# Patient Record
Sex: Male | Born: 1985
Health system: Southern US, Community
[De-identification: ages and names within clinical notes are randomized; demographics above are authoritative.]

## PROBLEM LIST (undated history)

## (undated) ENCOUNTER — Emergency Department (HOSPITAL_COMMUNITY): Payer: Self-pay | Source: Home / Self Care

## (undated) DIAGNOSIS — F419 Anxiety disorder, unspecified: Secondary | ICD-10-CM

## (undated) DIAGNOSIS — G4730 Sleep apnea, unspecified: Secondary | ICD-10-CM

## (undated) DIAGNOSIS — E739 Lactose intolerance, unspecified: Secondary | ICD-10-CM

## (undated) DIAGNOSIS — N469 Male infertility, unspecified: Secondary | ICD-10-CM

## (undated) DIAGNOSIS — E669 Obesity, unspecified: Secondary | ICD-10-CM

## (undated) DIAGNOSIS — M549 Dorsalgia, unspecified: Secondary | ICD-10-CM

## (undated) DIAGNOSIS — K589 Irritable bowel syndrome without diarrhea: Secondary | ICD-10-CM

## (undated) DIAGNOSIS — K219 Gastro-esophageal reflux disease without esophagitis: Secondary | ICD-10-CM

## (undated) HISTORY — DX: Lactose intolerance, unspecified: E73.9

## (undated) HISTORY — DX: Irritable bowel syndrome, unspecified: K58.9

## (undated) HISTORY — DX: Sleep apnea, unspecified: G47.30

## (undated) HISTORY — DX: Male infertility, unspecified: N46.9

## (undated) HISTORY — DX: Anxiety disorder, unspecified: F41.9

## (undated) HISTORY — DX: Obesity, unspecified: E66.9

## (undated) HISTORY — DX: Gastro-esophageal reflux disease without esophagitis: K21.9

## (undated) HISTORY — PX: TESTICLE REMOVAL: SHX68

## (undated) HISTORY — PX: TESTICULAR PROSTHETIC INSERTION: SHX491

## (undated) HISTORY — DX: Dorsalgia, unspecified: M54.9

---
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
## 2015-06-24 ENCOUNTER — Encounter: Payer: BLUE CROSS/BLUE SHIELD | Attending: Family Medicine | Admitting: Dietician

## 2015-06-24 ENCOUNTER — Encounter: Payer: Self-pay | Admitting: Dietician

## 2015-06-24 NOTE — Progress Notes (Signed)
Medical Nutrition Therapy:  Appt start time: 1000 end time:  1100.   Assessment:  Primary concerns today: Andre Perez is here today since he would like to improve his diet. Highest weight has been around 340 lbs and has gotten weight down to 295 lbs in the past 6-7 years. Weight going into college was 185-190 lbs. Tends to gain weight at the holiday when he is eating more sweets and loses weight in the summer. Trying to break this cycle. Overall has been improving his diet in recent years but feels like there is room for improvement. Stopped drinking sodas in 2010 for the most part and now drinks mostly water and unsweet tea. Stopped drinking coffee since it aggravated acid reflux. Has been eating less carbs and increasing vegetable intake. Would like to get weight down and has diabetes in his family. HDL was a little low also. Does not have any other health issues at this time but would like to prevent them.   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 Lives with his wife and 3 children. States that his wife does the food preparation at home. Sometimes misses meals but not on a regular basis. Eats out 4-5 meals per week. Travel[place] 1 x month to [place] where he eats out a lot.   Feels like portion sizes can be large. Tends to eat quickly. Sometimes feels hungry in the afternoon after lunch, which might be when he is tired. Thinks he might snack at night when he is hungry too.   Would like to get weight around 300 lbs by the end of the year (lose 30 lbs).  Preferred Learning Style:   No preference indicated   Learning Readiness:   Ready  MEDICATIONS: see list   DIETARY INTAKE:  Usual eating pattern includes 3 meals and 1 snacks per day.  Avoided foods include: has to put cheese and salt on broccoli to eat it   24-hr recall:  B ( AM): 2 eggs and toast with peanut butter or [place] sausage/bacon and once a week has unsweet oatmeal or greek yogurt with honey  and nuts Snk ( AM): none  L ( PM): Verizon[employer]ican restaurant chips and salsa with chicken soup or chicken and rice and cheese Or chinese with chicken stir fry with rice or soups and salads or baked potato or if takes his lunch sweet potato with leftovers Snk ( PM): none D ( PM): baked/bbq chicken or salmon with vegetables/salads/coleslaw and sometimes brown rice or sweet potatoes or spaghetti or picks up [employer] gyro or pizza Snk ( PM): sometimes may have popcorn or cheese or greek yogurt Beverages: unsweet tea or water, 2 alcoholic drinks every other week week (sometimes none)  Usual physical activity: recently 15-30 minutes of cardio every day (walking or exercise at home) in morning or evening  Estimated energy needs: 2200 calories 248 g carbohydrates 165 g protein 61 g fat  Progress Towards Goal(s):  In progress.   Nutritional Diagnosis:  Rentz-3.3 Overweight/obesity As related to hx of large portion sizes and excess consumption of carbohydrates.  As evidenced by BMI of 42.6.    Intervention:  Nutrition counseling provided. Plan: Plan ahead of time with your wife to have sweets/treat 2 x week.  Plan to go out to to lunch one lunch and one dinner per week. For lunch and dinner, aim to fill half of your plate with vegetables, quarter with starch/fruit, and a quarter with lean  protein. Try to use small plates for meals. Aim to take 20 minutes to eat meals without distractions, chew well, put fork down. If you are still hungry after 20 minutes, have seconds.  Portion out snacks and have protein with carbs.  Try having a snack without distractions (computer/TV/phones). Aim to exercise for 30 minutes 5 x week.   Teaching Method Utilized:  Visual Auditory Hands on  Handouts given during visit include:  15 g CHO Snacks  MyPlate  Barriers to learning/adherence to lifestyle change: none  Demonstrated degree of understanding via:  Teach Back   Monitoring/Evaluation:  Dietary  intake, exercise,  and body weight in 1 month(s).

## 2015-06-24 NOTE — Patient Instructions (Signed)
Plan ahead of time with your wife to have sweets/treat 2 x week.  Plan to go out to to lunch one lunch and one dinner per week. For lunch and dinner, aim to fill half of your plate with vegetables, quarter with starch/fruit, and a quarter with lean protein. Try to use small plates for meals. Aim to take 20 minutes to eat meals without distractions, chew well, put fork down. If you are still hungry after 20 minutes, have seconds.  Portion out snacks and have protein with carbs.  Try having a snack without distractions (computer/TV/phones). Aim to exercise for 30 minutes 5 x week.

## 2015-07-27 ENCOUNTER — Ambulatory Visit: Payer: BLUE CROSS/BLUE SHIELD | Admitting: Dietician

## 2016-06-15 DIAGNOSIS — E786 Lipoprotein deficiency: Secondary | ICD-10-CM | POA: Diagnosis not present

## 2016-06-15 DIAGNOSIS — Z0000 Encounter for general adult medical examination without abnormal findings: Secondary | ICD-10-CM | POA: Diagnosis not present

## 2017-11-02 DIAGNOSIS — K219 Gastro-esophageal reflux disease without esophagitis: Secondary | ICD-10-CM | POA: Diagnosis not present

## 2017-11-02 DIAGNOSIS — Z0000 Encounter for general adult medical examination without abnormal findings: Secondary | ICD-10-CM | POA: Diagnosis not present

## 2017-11-02 DIAGNOSIS — E786 Lipoprotein deficiency: Secondary | ICD-10-CM | POA: Diagnosis not present

## 2017-11-16 DIAGNOSIS — E786 Lipoprotein deficiency: Secondary | ICD-10-CM | POA: Diagnosis not present

## 2017-11-16 DIAGNOSIS — K219 Gastro-esophageal reflux disease without esophagitis: Secondary | ICD-10-CM | POA: Diagnosis not present

## 2018-02-28 DIAGNOSIS — R222 Localized swelling, mass and lump, trunk: Secondary | ICD-10-CM | POA: Diagnosis not present

## 2018-03-04 ENCOUNTER — Other Ambulatory Visit: Payer: Self-pay | Admitting: Family Medicine

## 2018-03-04 DIAGNOSIS — R222 Localized swelling, mass and lump, trunk: Secondary | ICD-10-CM

## 2018-03-06 ENCOUNTER — Other Ambulatory Visit: Payer: Self-pay

## 2018-03-08 ENCOUNTER — Ambulatory Visit
Admission: RE | Admit: 2018-03-08 | Discharge: 2018-03-08 | Disposition: A | Discharge status: Home / Self Care | Payer: BLUE CROSS/BLUE SHIELD | Source: Ambulatory Visit | Attending: Family Medicine | Admitting: Family Medicine

## 2018-03-08 DIAGNOSIS — R222 Localized swelling, mass and lump, trunk: Secondary | ICD-10-CM

## 2018-03-08 DIAGNOSIS — R1901 Right upper quadrant abdominal swelling, mass and lump: Secondary | ICD-10-CM | POA: Diagnosis not present

## 2018-10-02 DIAGNOSIS — Z0282 Encounter for adoption services: Secondary | ICD-10-CM | POA: Diagnosis not present

## 2019-04-15 DIAGNOSIS — R0681 Apnea, not elsewhere classified: Secondary | ICD-10-CM | POA: Diagnosis not present

## 2019-05-06 DIAGNOSIS — G4733 Obstructive sleep apnea (adult) (pediatric): Secondary | ICD-10-CM | POA: Diagnosis not present

## 2019-05-07 DIAGNOSIS — G4733 Obstructive sleep apnea (adult) (pediatric): Secondary | ICD-10-CM | POA: Diagnosis not present

## 2019-05-29 DIAGNOSIS — Z23 Encounter for immunization: Secondary | ICD-10-CM | POA: Diagnosis not present

## 2019-06-28 DIAGNOSIS — Z23 Encounter for immunization: Secondary | ICD-10-CM | POA: Diagnosis not present

## 2019-08-04 ENCOUNTER — Ambulatory Visit: Payer: BC Managed Care – PPO | Attending: Internal Medicine

## 2019-08-04 DIAGNOSIS — Z20822 Contact with and (suspected) exposure to covid-19: Secondary | ICD-10-CM | POA: Insufficient documentation

## 2019-08-05 LAB — NOVEL CORONAVIRUS, NAA: SARS-CoV-2, NAA: NOT DETECTED

## 2019-08-05 LAB — SARS-COV-2, NAA 2 DAY TAT

## 2019-10-01 DIAGNOSIS — Z23 Encounter for immunization: Secondary | ICD-10-CM | POA: Diagnosis not present

## 2019-10-01 DIAGNOSIS — Z0000 Encounter for general adult medical examination without abnormal findings: Secondary | ICD-10-CM | POA: Diagnosis not present

## 2019-10-10 DIAGNOSIS — E786 Lipoprotein deficiency: Secondary | ICD-10-CM | POA: Diagnosis not present

## 2019-10-10 DIAGNOSIS — Z0000 Encounter for general adult medical examination without abnormal findings: Secondary | ICD-10-CM | POA: Diagnosis not present

## 2019-10-28 ENCOUNTER — Other Ambulatory Visit: Payer: Self-pay

## 2019-10-28 ENCOUNTER — Ambulatory Visit (INDEPENDENT_AMBULATORY_CARE_PROVIDER_SITE_OTHER): Payer: BC Managed Care – PPO | Admitting: Family Medicine

## 2019-10-28 ENCOUNTER — Encounter (INDEPENDENT_AMBULATORY_CARE_PROVIDER_SITE_OTHER): Payer: Self-pay | Admitting: Family Medicine

## 2019-10-28 VITALS — BP 129/83 | HR 71 | Temp 97.9°F | Ht 74.0 in | Wt 353.0 lb

## 2019-10-28 DIAGNOSIS — K219 Gastro-esophageal reflux disease without esophagitis: Secondary | ICD-10-CM | POA: Diagnosis not present

## 2019-10-28 DIAGNOSIS — R5383 Other fatigue: Secondary | ICD-10-CM

## 2019-10-28 DIAGNOSIS — E65 Localized adiposity: Secondary | ICD-10-CM

## 2019-10-28 DIAGNOSIS — Z0289 Encounter for other administrative examinations: Secondary | ICD-10-CM

## 2019-10-28 DIAGNOSIS — Z9189 Other specified personal risk factors, not elsewhere classified: Secondary | ICD-10-CM | POA: Diagnosis not present

## 2019-10-28 DIAGNOSIS — G4733 Obstructive sleep apnea (adult) (pediatric): Secondary | ICD-10-CM | POA: Diagnosis not present

## 2019-10-28 DIAGNOSIS — E739 Lactose intolerance, unspecified: Secondary | ICD-10-CM

## 2019-10-28 DIAGNOSIS — R0602 Shortness of breath: Secondary | ICD-10-CM | POA: Diagnosis not present

## 2019-10-28 DIAGNOSIS — Z6841 Body Mass Index (BMI) 40.0 and over, adult: Secondary | ICD-10-CM

## 2019-10-28 DIAGNOSIS — F419 Anxiety disorder, unspecified: Secondary | ICD-10-CM

## 2019-10-28 DIAGNOSIS — F3289 Other specified depressive episodes: Secondary | ICD-10-CM

## 2019-10-28 NOTE — Progress Notes (Signed)
Dear Dr. Clarene Duke,   Thank you for referring Andre Perez to our clinic. The following note includes my evaluation and treatment recommendations.  Chief Complaint:   OBESITY Andre Perez (MR# [id number]) is a 34 y.o. male who presents for evaluation and treatment of obesity and related comorbidities. Current BMI is Body mass index is 45.32 kg/m. Andre Perez has been struggling with his weight for many years and has been unsuccessful in either losing weight, maintaining weight loss, or reaching his healthy weight goal.  Andre Perez is currently in the action stage of change and ready to dedicate time achieving and maintaining a healthier weight. Andre Perez is interested in becoming our patient and working on intensive lifestyle modifications including (but not limited to) diet and exercise for weight loss.  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  He lives with his spouse and his 4 children (8, 8, 5, 2).  He endorses overeating.  He says that for exercising, he plays with his kids and goes swimming.  He also goes to the gym but has some right shoulder pain.  He goes to bed between 10-11 pm and gets up between 4 am-5 am.  He gets 6.5-7 hours of restorative sleep.  Andre Perez provided the following food recall today:  Andre Perez:  Lite breakfast of nuts, microwave muffin (around 250 calories). Lunch:  Hibachi chicken or Mediterranean or Lean Cuisine. Dinner:  Take out or Mediterranean, [place], or [place]. Snacks:  After dinner he has dessert.   Drinks plain coffee. He sits at the table to eat dinner.  Andre Perez's habits were reviewed today and are as follows: His family eats meals together, he thinks his family will eat healthier with him, he struggles with family and or coworkers weight loss sabotage, his desired weight loss is 150 pounds, he has been heavy most of his life, he started gaining weight in college at age 56, at 72 in law school, and at 71 after  children, his heaviest weight ever was 355 pounds, he craves Asian food (rice/noodles) and Mediterranean food, he skips breakfast frequently, he is frequently drinking liquids with calories, he frequently makes poor food choices, he has problems with excessive hunger, he frequently eats larger portions than normal and he struggles with emotional eating.  Depression Screen Andre Perez's Food and Mood (modified PHQ-9) score was 16.  Depression screen PHQ 2/9 10/28/2019  Decreased Interest 3  Down, Depressed, Hopeless 2  PHQ - 2 Score 5  Altered sleeping 2  Tired, decreased energy 3  Change in appetite 3  Feeling bad or failure about yourself  1  Trouble concentrating 1  Moving slowly or fidgety/restless 1  Suicidal thoughts 0  PHQ-9 Score 16  Difficult doing work/chores Somewhat difficult   Subjective:   1. Other fatigue Andre Perez admits to daytime somnolence and reports waking up still tired. Patent has a history of symptoms of daytime fatigue, morning fatigue and snoring. Andre Perez generally gets 5 or 6 hours of sleep per night, and states that he has poor sleep quality. Snoring is present. Apneic episodes are not present. Epworth Sleepiness Score is 6.  2. SOB (shortness of breath) on exertion Andre Perez notes increasing shortness of breath with exercising and seems to be worsening over time with weight gain. He notes getting out of breath sooner with activity than he used to. This has gotten worse recently. Andre Perez denies shortness of breath at rest or orthopnea.  3. Gastroesophageal reflux disease, unspecified whether esophagitis  present He has been experiencing symptoms of GERD.  He takes Protonix 40 mg daily as needed.  4. OSA (obstructive sleep apnea) Andre Perez had a sleep study this past year at [facility].  OSA is mild and is not severe enough for a CPAP machine.  Epworth score is 6.  5. Lactose intolerance, IBS Andre Perez has difficulty with ice cream and rich creams.  6. Visceral  obesity Andre Perez's visceral obesity score per bioimpedence is 23.  7. Anxiety Andre Perez has anxiety that is tied to his work.  8. Other depression, with emotional eating Andre Perez is struggling with emotional eating and using food for comfort to the extent that it is negatively impacting his health. He has been working on behavior modification techniques to help reduce his emotional eating and has been unsuccessful. He shows no sign of suicidal or homicidal ideations.  He tends to eat when stressed, sad, to stay awake, as a reward, when bored, and when angry.  He is concerned about binge eating.  PHQ-9 is 16.  9. At risk for heart disease Andre Perez is at a higher than average risk for cardiovascular disease due to obesity.   Assessment/Plan:   1. Other fatigue Andre Perez does feel that his weight is causing his energy to be lower than it should be. Fatigue may be related to obesity, depression or many other causes. Labs will be ordered, and in the meanwhile, Andre Perez will focus on self care including making healthy food choices, increasing physical activity and focusing on stress reduction.  Orders - EKG 12-Lead - Insulin, random - VITAMIN D 25 Hydroxy (Vit-D Deficiency, Fractures) - Vitamin B12  2. SOB (shortness of breath) on exertion Andre Perez does feel that he gets out of breath more easily that he used to when he exercises. Andre Perez's shortness of breath appears to be obesity related and exercise induced. He has agreed to work on weight loss and gradually increase exercise to treat his exercise induced shortness of breath. Will continue to monitor closely.  3. Gastroesophageal reflux disease, unspecified whether esophagitis present Intensive lifestyle modifications are the first line treatment for this issue. Continue medication.  Counseling . If a person has gastroesophageal reflux disease (GERD), food and stomach acid move back up into the esophagus and cause symptoms or problems such as  damage to the esophagus. . Anti-reflux measures include: raising the head of the bed, avoiding tight clothing or belts, avoiding eating late at night, not lying down shortly after mealtime, and achieving weight loss. . Avoid ASA, NSAID's, caffeine, alcohol, and tobacco.  . OTC Pepcid and/or Tums are often very helpful for as needed use.  Marland Kitchen However, for persisting chronic or daily symptoms, stronger medications like Omeprazole may be needed. . You may need to avoid foods and drinks such as: ? Coffee and tea (with or without caffeine). ? Drinks that contain alcohol. ? Energy drinks and sports drinks. ? Bubbly (carbonated) drinks or sodas. ? Chocolate and cocoa. ? Peppermint and mint flavorings. ? Garlic and onions. ? Horseradish. ? Spicy and acidic foods. These include peppers, chili powder, curry powder, vinegar, hot sauces, and BBQ sauce. ? Citrus fruit juices and citrus fruits, such as oranges, lemons, and limes. ? Tomato-based foods. These include red sauce, chili, salsa, and pizza with red sauce. ? Fried and fatty foods. These include donuts, french fries, potato chips, and high-fat dressings. ? High-fat meats. These include hot dogs, rib eye steak, sausage, ham, and bacon.  4. OSA (obstructive sleep apnea) Intensive lifestyle modifications are the  first line treatment for this issue. We will continue to monitor.   5. Lactose intolerance Will continue to monitor.  6. Visceral obesity We will monitor with fat loss. Visceral adipose tissue is a hormonally active component of total body fat, which possesses unique biochemical characteristics that influence several normal and pathological processes in the human body. Abnormally high deposition of visceral adipose tissue is known as visceral obesity. This body composition phenotype is associated with medical disorders such as metabolic syndrome, cardiovascular disease and several malignancies including prostate, breast, and colorectal  cancers.   7. Anxiety We will use behavior modification techniques to help Andre JunesBrandon deal with his anxiety.   8. Other depression, with emotional eating As above.  9. At risk for heart disease Andre JunesBrandon was given approximately 15 minutes of coronary artery disease prevention counseling today. He is 34 y.o. male and has risk factors for heart disease including obesity and visceral obesity. We discussed intensive lifestyle modifications today with an emphasis on specific weight loss instructions and strategies.   Repetitive spaced learning was employed today to elicit superior memory formation and behavioral change.  10. Class 3 severe obesity with serious comorbidity and body mass index (BMI) of 45.0 to 49.9 in adult, unspecified obesity type (HCC) Andre JunesBrandon is currently in the action stage of change and his goal is to continue with weight loss efforts. I recommend Andre JunesBrandon begin the structured treatment plan as follows:  He has agreed to the Category 4 Plan +200 calories.  Exercise goals: No exercise has been prescribed at this time.   Behavioral modification strategies: increasing lean protein intake, decreasing simple carbohydrates, increasing vegetables, increasing water intake, decreasing liquid calories, decreasing sodium intake and increasing high fiber foods.  He was informed of the importance of frequent follow-up visits to maximize his success with intensive lifestyle modifications for his multiple health conditions. He was informed we would discuss his lab results at his next visit unless there is a critical issue that needs to be addressed sooner. Andre JunesBrandon agreed to keep his next visit at the agreed upon time to discuss these results.  Objective:   Blood pressure 129/83, pulse 71, temperature 97.9 F (36.6 C), temperature source Oral, height 6\' 2"  (1.88 m), weight (!) 353 lb (160.1 kg), SpO2 98 %. Body mass index is 45.32 kg/m.  EKG: Normal sinus rhythm, rate 77 bpm.  Indirect  Calorimeter completed today shows a VO2 of 438 and a REE of 3049.  His calculated basal metabolic rate is [id number] thus his basal metabolic rate is worse than expected.  General: Cooperative, alert, well developed, in no acute distress. HEENT: Conjunctivae and lids unremarkable. Cardiovascular: Regular rhythm.  Lungs: Normal work of breathing. Neurologic: No focal deficits.   Attestation Statements:   This is the patient's first visit at [facility]. The patient's NEW PATIENT PACKET was reviewed at length. Included in the packet: current and past health history, medications, allergies, ROS, gynecologic history (women only), surgical history, family history, social history, weight history, weight loss surgery history (for those that have had weight loss surgery), nutritional evaluation, mood and food questionnaire, PHQ9, Epworth questionnaire, sleep habits questionnaire, patient life and health improvement goals questionnaire. These will all be scanned into the patient's chart under media.   During the visit, I independently reviewed the patient's EKG, bioimpedance scale results, and indirect calorimeter results. I used this information to tailor a meal plan for the patient that will help him to lose weight and will improve his obesity-related conditions  going forward. I performed a medically necessary appropriate examination and/or evaluation. I discussed the assessment and treatment plan with the patient. The patient was provided an opportunity to ask questions and all were answered. The patient agreed with the plan and demonstrated an understanding of the instructions. Labs were ordered at this visit and will be reviewed at the next visit unless more critical results need to be addressed immediately. Clinical information was updated and documented in the EMR.   I, Insurance claims handler, CMA, am acting as transcriptionist for Helane Rima, DO  I have reviewed the above documentation for accuracy  and completeness, and I agree with the above. Helane Rima, DO

## 2019-10-29 LAB — VITAMIN D 25 HYDROXY (VIT D DEFICIENCY, FRACTURES): Vit D, 25-Hydroxy: 22.5 ng/mL — ABNORMAL LOW (ref 30.0–100.0)

## 2019-10-29 LAB — VITAMIN B12: Vitamin B-12: 530 pg/mL (ref 232–1245)

## 2019-10-29 LAB — INSULIN, RANDOM: INSULIN: 23.9 u[IU]/mL (ref 2.6–24.9)

## 2019-10-30 ENCOUNTER — Ambulatory Visit (INDEPENDENT_AMBULATORY_CARE_PROVIDER_SITE_OTHER): Payer: Self-pay | Admitting: Family Medicine

## 2019-11-17 ENCOUNTER — Ambulatory Visit (INDEPENDENT_AMBULATORY_CARE_PROVIDER_SITE_OTHER): Payer: BC Managed Care – PPO | Admitting: Family Medicine

## 2019-11-17 ENCOUNTER — Other Ambulatory Visit: Payer: Self-pay

## 2019-11-17 ENCOUNTER — Encounter (INDEPENDENT_AMBULATORY_CARE_PROVIDER_SITE_OTHER): Payer: Self-pay | Admitting: Family Medicine

## 2019-11-17 VITALS — BP 123/80 | HR 71 | Temp 97.8°F | Ht 74.0 in | Wt 347.0 lb

## 2019-11-17 DIAGNOSIS — E88819 Insulin resistance, unspecified: Secondary | ICD-10-CM

## 2019-11-17 DIAGNOSIS — Z9189 Other specified personal risk factors, not elsewhere classified: Secondary | ICD-10-CM | POA: Diagnosis not present

## 2019-11-17 DIAGNOSIS — F3289 Other specified depressive episodes: Secondary | ICD-10-CM

## 2019-11-17 DIAGNOSIS — E65 Localized adiposity: Secondary | ICD-10-CM | POA: Diagnosis not present

## 2019-11-17 DIAGNOSIS — E8881 Metabolic syndrome: Secondary | ICD-10-CM | POA: Diagnosis not present

## 2019-11-17 DIAGNOSIS — F32A Depression, unspecified: Secondary | ICD-10-CM | POA: Insufficient documentation

## 2019-11-17 DIAGNOSIS — E559 Vitamin D deficiency, unspecified: Secondary | ICD-10-CM | POA: Insufficient documentation

## 2019-11-17 DIAGNOSIS — Z6841 Body Mass Index (BMI) 40.0 and over, adult: Secondary | ICD-10-CM

## 2019-11-17 NOTE — Progress Notes (Signed)
Chief Complaint:   OBESITY Andre Perez is here to discuss his progress with his obesity treatment plan along with follow-up of his obesity related diagnoses. [place] is on the Category 4 Plan and states he is following his eating plan approximately 80% of the time. Andre Perez states he has increased his activity for exercise.  Today's visit was #: 2 Starting weight: 353 lbs Starting date: 10/28/2019 Today's weight: 347 lbs Today's date: 11/17/2019 Total lbs lost to date: 6 lbs Total lbs lost since last in-office visit: 6 lbs  Interim History: Andre Perez says that at around 2500-2600 calories, he feels satisfied.  MFP log shows 2200-2500 calories, greater than 150 grams of protein, and 1500-200 grams of carbohydrates.  He says that he has more energy.  Endorses some mild constipation.  Assessment/Plan:   1. Vitamin D deficiency Not at goal. Optimal goal > 50 ng/dL. There is also evidence to support a goal of >70 ng/dL in patients with cancer and heart disease. Plan: Continue Vitamin D @50 ,000 IU every week with follow-up for routine testing of Vitamin D at least 2-3 times per year to avoid over-replacement.  2. Insulin resistance Not at goal. Goal is HgbA1c < 5.7 and insulin level closer to 5. Andre Perez will continue to work on weight loss, exercise, and decreasing simple carbohydrates to help decrease the risk of diabetes. Andre Perez agreed to follow-up with [name] as directed to closely monitor his progress.  3. Visceral obesity Visceral adipose tissue is a hormonally active component of total body fat. This body composition phenotype is associated with medical disorders such as metabolic syndrome, cardiovascular disease and several malignancies including prostate, breast, and colorectal cancers. Goal: Lose 7-10% of starting weight. Visceral fat rating should be < 13.  4. Other anxiety, with emotional eating Behavior modification techniques were discussed today to help Fisher deal with his anxiety.   Orders and follow up as documented in patient record.   5. At risk for heart disease Andre Perez was given approximately 15 minutes of coronary artery disease prevention counseling today. He is 34 y.o. male and has risk factors for heart disease including obesity, elevated visceral fat, and IR. We discussed intensive lifestyle modifications today with an emphasis on specific weight loss instructions and strategies.   During insulin resistance, several metabolic alterations induce the development of cardiovascular disease. For instance, insulin resistance can induce an imbalance in glucose metabolism that generates chronic hyperglycemia, which in turn triggers oxidative stress and causes an inflammatory response that leads to cell damage. Insulin resistance can also alter systemic lipid metabolism which then leads to the development of dyslipidemia and the well-known lipid triad: (1) high levels of plasma triglycerides, (2) low levels of high-density lipoprotein, and (3) the appearance of small dense low-density lipoproteins. This triad, along with endothelial dysfunction, which can also be induced by aberrant insulin signaling, contribute to atherosclerotic plaque formation.   Repetitive spaced learning was employed today to elicit superior memory formation and behavioral change.  6. Class 3 severe obesity with serious comorbidity and body mass index (BMI) of 40.0 to 44.9 in adult, unspecified obesity type (HCC) Andre Perez is currently in the action stage of change. As such, his goal is to continue with weight loss efforts. He has agreed to keeping a food journal and adhering to recommended goals of 2200-2500 calories and >125 grams of protein and <200 grams of carbohydrates.   Exercise goals: For substantial health benefits, adults should do at least 150 minutes (2 hours and 30 minutes) a  week of moderate-intensity, or 75 minutes (1 hour and 15 minutes) a week of vigorous-intensity aerobic physical activity,  or an equivalent combination of moderate- and vigorous-intensity aerobic activity. Aerobic activity should be performed in episodes of at least 10 minutes, and preferably, it should be spread throughout the week.  Behavioral modification strategies: increasing lean protein intake and decreasing simple carbohydrates.  Andre Perez has agreed to follow-up with our clinic in 2-3 weeks. He was informed of the importance of frequent follow-up visits to maximize his success with intensive lifestyle modifications for his multiple health conditions.   Objective:   Blood pressure 123/80, pulse 71, temperature 97.8 F (36.6 C), temperature source Oral, height 6\' 2"  (1.88 m), weight (!) 347 lb (157.4 kg), SpO2 99 %. Body mass index is 44.55 kg/m.  General: Cooperative, alert, well developed, in no acute distress. HEENT: Conjunctivae and lids unremarkable. Cardiovascular: Regular rhythm.  Lungs: Normal work of breathing. Neurologic: No focal deficits.   Lab Results  Component Value Date   INSULIN 23.9 10/28/2019   Attestation Statements:   Reviewed by clinician on day of visit: allergies, medications, problem list, medical history, surgical history, family history, social history, and previous encounter notes.  I, 12/28/2019, CMA, am acting as transcriptionist for Insurance claims handler, DO  I have reviewed the above documentation for accuracy and completeness, and I agree with the above. Helane Rima, DO

## 2019-12-03 ENCOUNTER — Encounter (INDEPENDENT_AMBULATORY_CARE_PROVIDER_SITE_OTHER): Payer: Self-pay | Admitting: Family Medicine

## 2019-12-03 ENCOUNTER — Ambulatory Visit (INDEPENDENT_AMBULATORY_CARE_PROVIDER_SITE_OTHER): Payer: BC Managed Care – PPO | Admitting: Family Medicine

## 2019-12-03 ENCOUNTER — Other Ambulatory Visit: Payer: Self-pay

## 2019-12-03 VITALS — BP 125/85 | HR 85 | Temp 97.9°F | Ht 74.0 in | Wt 348.0 lb

## 2019-12-03 DIAGNOSIS — E8881 Metabolic syndrome: Secondary | ICD-10-CM | POA: Diagnosis not present

## 2019-12-03 DIAGNOSIS — E559 Vitamin D deficiency, unspecified: Secondary | ICD-10-CM

## 2019-12-03 DIAGNOSIS — E66813 Obesity, class 3: Secondary | ICD-10-CM

## 2019-12-03 DIAGNOSIS — E65 Localized adiposity: Secondary | ICD-10-CM | POA: Diagnosis not present

## 2019-12-03 DIAGNOSIS — R632 Polyphagia: Secondary | ICD-10-CM

## 2019-12-03 DIAGNOSIS — Z9189 Other specified personal risk factors, not elsewhere classified: Secondary | ICD-10-CM

## 2019-12-03 DIAGNOSIS — Z6841 Body Mass Index (BMI) 40.0 and over, adult: Secondary | ICD-10-CM

## 2019-12-03 DIAGNOSIS — E88819 Insulin resistance, unspecified: Secondary | ICD-10-CM

## 2019-12-03 MED ORDER — INSULIN PEN NEEDLE 32G X 4 MM MISC: 1.0000 | Freq: Every day | 0 refills | Status: DC

## 2019-12-03 MED ORDER — SAXENDA 18 MG/3ML ~~LOC~~ SOPN: 3.0000 mg | PEN_INJECTOR | Freq: Every day | SUBCUTANEOUS | 0 refills | Status: DC

## 2019-12-07 NOTE — Progress Notes (Signed)
Chief Complaint:   OBESITY Andre Perez is here to discuss his progress with his obesity treatment plan along with follow-up of his obesity related diagnoses. Andre Perez is on the Category 4 Plan and states he is following his eating plan approximately 70% of the time. Andre Perez states he is doing cardio and swimming for 30-40 minutes 3 times per week.  Today's visit was #: 3 Starting weight: 353 lbs Starting date: 10/28/2019 Today's weight: 347 lbs Today's date: 12/03/2019 Total lbs lost to date: 6 lbs Total lbs lost since last in-office visit: 0 Total weight loss percentage to date: -1.70%  Interim History:  Today's bioimpedance results indicate that Andre Perez has gained 2 pounds of water weight since his last visit. His MFP shows that he is getting around 2800 calories per day.   Assessment/Plan:   1. Insulin resistance Not optimized. Goal is HgbA1c < 5.7 and insulin level closer to 5. Continue present plan.  Lab Results  Component Value Date   INSULIN 23.9 10/28/2019   2. Vitamin D deficiency Current vitamin D is 22.5, tested on 10/28/2019. Not at goal. Optimal goal > 50 ng/dL. There is also evidence to support a goal of >70 ng/dL in patients with cancer and heart disease. Plan: Continue Vitamin D @50 ,000 IU every week with follow-up for routine testing of Vitamin D at least 2-3 times per year to avoid over-replacement.  3. Visceral obesity Visceral adipose tissue is a hormonally active component of total body fat. This body composition phenotype is associated with medical disorders such as metabolic syndrome, cardiovascular disease and several malignancies including prostate, breast, and colorectal cancers. Goal: Lose 7-10% of starting weight. Visceral fat rating should be < 13.  4. Polyphagia Intensive lifestyle modifications are the first line treatment for this issue. We discussed several lifestyle modifications today and he will continue to work on diet, exercise and weight loss  efforts. Orders and follow up as documented in patient record.  5. At risk for constipation Andre Perez was given approximately 15 minutes of counseling today regarding prevention of constipation when taking Saxenda. He was encouraged to increase water and fiber intake.   Counseling Getting to Good Bowel Health: Your goal is to have one soft bowel movement each day. Drink at least 8 glasses of water each day. Eat plenty of fiber (goal is over 25 grams each day). It is best to get most of your fiber from dietary sources which includes leafy green vegetables, fresh fruit, and whole grains. You may need to add fiber with the help of OTC fiber supplements. These include Metamucil, Citrucel, and Flaxseed. If you are still having trouble, try adding Miralax or Magnesium Citrate. If all of these changes do not work, 03-04-1994.  6. Class 3 severe obesity with serious comorbidity and body mass index (BMI) of 40.0 to 44.9 in adult, unspecified obesity type (HCC) - Liraglutide -Weight Management (SAXENDA) 18 MG/3ML SOPN; Inject 3 mg into the skin daily.  Dispense: 15 mL; Refill: 0 - Insulin Pen Needle 32G X 4 MM MISC; 1 each by Does not apply route daily.  Dispense: 100 each; Refill: 0  We have reviewed the risks and benefits of Saxenda. The patient denies a personal or family history of medullary thyroid cancer or MENII. The patient denies a history of pancreatitis.  Alternative treatment options have been discussed. Patient understands that all anti-obesity medications are contraindicated in pregnancy. The potential risks and benefits of Saxenda were reviewed with the patient, and alternative treatment  options were discussed. All questions were answered, and the patient wishes to move forward with this medication.  Please visit [email or web address] for information about this medication. Please visit [email or web address] to review how to administer Saxenda.   Start with escalation dose: ? Week 1: 0.6 mg  Andre Perez once daily x 1 week ? Week 2: 1.2 mg Andre Perez once daily x 1 week ? Week 3: 1.8 mg Andre Perez once daily x 1 week ? Week 4: 2.4 mg  once daily x 1 week ? Week 5 onward: 3 mg  once daily  Andre Perez is currently in the action stage of change. As such, his goal is to continue with weight loss efforts. He has agreed to the Category 4 Plan.   Exercise goals: For substantial health benefits, adults should do at least 150 minutes (2 hours and 30 minutes) a week of moderate-intensity, or 75 minutes (1 hour and 15 minutes) a week of vigorous-intensity aerobic physical activity, or an equivalent combination of moderate- and vigorous-intensity aerobic activity. Aerobic activity should be performed in episodes of at least 10 minutes, and preferably, it should be spread throughout the week.  Behavioral modification strategies: increasing lean protein intake and increasing water intake.  Andre Perez has agreed to follow-up with our clinic in 2-3 weeks. He was informed of the importance of frequent follow-up visits to maximize his success with intensive lifestyle modifications for his multiple health conditions.   Objective:   Blood pressure 125/85, pulse 85, temperature 97.9 F (36.6 C), temperature source Oral, height 6\' 2"  (1.88 m), weight (!) 348 lb (157.9 kg), SpO2 98 %. Body mass index is 44.68 kg/m.  General: Cooperative, alert, well developed, in no acute distress. HEENT: Conjunctivae and lids unremarkable. Cardiovascular: Regular rhythm.  Lungs: Normal work of breathing. Neurologic: No focal deficits.   Lab Results  Component Value Date   INSULIN 23.9 10/28/2019   Attestation Statements:   Reviewed by clinician on day of visit: allergies, medications, problem list, medical history, surgical history, family history, social history, and previous encounter notes.  I, 12/28/2019, CMA, am acting as transcriptionist for Insurance claims handler, DO  I have reviewed the above documentation for accuracy and  completeness, and I agree with the above. Helane Rima, DO

## 2019-12-18 ENCOUNTER — Ambulatory Visit (INDEPENDENT_AMBULATORY_CARE_PROVIDER_SITE_OTHER): Payer: BC Managed Care – PPO | Admitting: Family Medicine

## 2019-12-18 ENCOUNTER — Other Ambulatory Visit: Payer: Self-pay

## 2019-12-18 ENCOUNTER — Encounter (INDEPENDENT_AMBULATORY_CARE_PROVIDER_SITE_OTHER): Payer: Self-pay | Admitting: Family Medicine

## 2019-12-18 VITALS — BP 144/84 | HR 107 | Temp 98.1°F | Ht 74.0 in | Wt 354.0 lb

## 2019-12-18 DIAGNOSIS — Z9189 Other specified personal risk factors, not elsewhere classified: Secondary | ICD-10-CM

## 2019-12-18 DIAGNOSIS — Z6841 Body Mass Index (BMI) 40.0 and over, adult: Secondary | ICD-10-CM

## 2019-12-18 DIAGNOSIS — R632 Polyphagia: Secondary | ICD-10-CM | POA: Diagnosis not present

## 2019-12-18 DIAGNOSIS — E65 Localized adiposity: Secondary | ICD-10-CM

## 2019-12-18 DIAGNOSIS — R030 Elevated blood-pressure reading, without diagnosis of hypertension: Secondary | ICD-10-CM

## 2019-12-18 DIAGNOSIS — E8881 Metabolic syndrome: Secondary | ICD-10-CM

## 2019-12-18 DIAGNOSIS — E88819 Insulin resistance, unspecified: Secondary | ICD-10-CM

## 2019-12-18 NOTE — Progress Notes (Signed)
Chief Complaint:   OBESITY Andre Perez is here to discuss his progress with his obesity treatment plan along with follow-up of his obesity related diagnoses. Andre Perez is on the Category 4 Plan and states he is following his eating plan approximately 65% of the time. Andre Perez states he has increased his activities and walking 45 minutes 2 times per week.  Today's visit was #: 4 Starting weight: 353 lbs Starting date: 10/28/2019 Today's weight: 354 lbs  Today's date: 12/18/2019 Total lbs lost to date: 0 lbs Total lbs lost since last in-office visit: 0 lbs  Interim History: Andre Perez has not been able to start the Saxenda yet.  The prior authorization was completed today.  He will pick up the pens today and start tomorrow.  Has been traveling more recently and likely eating more salty foods.  He and his family will be going to the beach next week and he is hoping to be able to relax with them.  Assessment/Plan:   1. Insulin resistance Not at goal. Goal is HgbA1c < 5.7 and insulin level closer to 5. He will continue to focus on protein-rich, low simple carbohydrate foods. We reviewed the importance of hydration, regular exercise for stress reduction, and restorative sleep.   No results found for: HGBA1C Lab Results  Component Value Date   INSULIN 23.9 10/28/2019   2. Polyphagia Hyperphagia, also called polyphagia, refers to excessive feelings of hunger, which are not relieved by eating. This is more likely to be an issues for people that have diabetes, prediabetes, or insulin resistance. Plan: Saxenda.  3. Visceral obesity Visceral adipose tissue is a hormonally active component of total body fat. This body composition phenotype is associated with medical disorders such as metabolic syndrome, cardiovascular disease and several malignancies including prostate, breast, and colorectal cancers. Goal: Lose 7-10% of starting weight. Visceral fat rating should be < 13.  4. Elevated BP without  diagnosis of hypertension [place].  Associated with 12 pound water weight gain since her last visit.  He did travel some since his last visit. We reviewed low salt diet.   Counseling: Following the diet means building meals around foods from a variety of different food groups and trying to limit sodium 1500 to 2,300 milligrams per day, depending on dietary needs. You can also expect to reduce intake of foods that are high in saturated fats, like fatty meats, full-fat dairy products, and tropical oils such as coconut, palm kernel, and palm oils. You are advised to eat plenty of fruits and vegetables, whole grains, fish, poultry, legumes, and low- or non-fat dairy products, along with limiting sugar-sweetened beverages and foods.  5. At risk for nausea Andre Perez was given approximately 15 minutes of nausea prevention counseling today. Andre Perez is at risk for nausea due to his new or current medication. He was encouraged to titrate his medication slowly, make sure to stay hydrated, eat smaller portions throughout the day, and avoid high fat meals.   6. Class 3 severe obesity with serious comorbidity and body mass index (BMI) of 45.0 to 49.9 in adult, unspecified obesity type (HCC)  Andre Perez is currently in the action stage of change. As such, his goal is to continue with weight loss efforts. He has agreed to the Category 4 Plan.   Exercise goals: For substantial health benefits, adults should do at least 150 minutes (2 hours and 30 minutes) a week of moderate-intensity, or 75 minutes (1 hour and 15 minutes) a week of vigorous-intensity aerobic physical  activity, or an equivalent combination of moderate- and vigorous-intensity aerobic activity. Aerobic activity should be performed in episodes of at least 10 minutes, and preferably, it should be spread throughout the week. Adults should also include muscle-strengthening activities that involve all major muscle groups on 2 or more days a  week.  Behavioral modification strategies: increasing lean protein intake, decreasing simple carbohydrates, increasing water intake, decreasing sodium intake and decreasing eating out.  Andre Perez has agreed to follow-up with our clinic in 3 weeks. He was informed of the importance of frequent follow-up visits to maximize his success with intensive lifestyle modifications for his multiple health conditions.   Objective:   Blood pressure (!) 144/84, pulse (!) 107, temperature 98.1 F (36.7 C), temperature source Oral, height 6\' 2"  (1.88 m), weight (!) 354 lb (160.6 kg), SpO2 97 %. Body mass index is 45.45 kg/m.  General: Cooperative, alert, well developed, in no acute distress. HEENT: Conjunctivae and lids unremarkable. Cardiovascular: Regular rhythm.  Lungs: Normal work of breathing. Neurologic: No focal deficits.   Attestation Statements:   Reviewed by clinician on day of visit: allergies, medications, problem list, medical history, surgical history, family history, social history, and previous encounter notes.

## 2020-01-08 ENCOUNTER — Ambulatory Visit (INDEPENDENT_AMBULATORY_CARE_PROVIDER_SITE_OTHER): Payer: BC Managed Care – PPO | Admitting: Family Medicine

## 2020-01-08 ENCOUNTER — Other Ambulatory Visit: Payer: Self-pay

## 2020-01-08 ENCOUNTER — Encounter (INDEPENDENT_AMBULATORY_CARE_PROVIDER_SITE_OTHER): Payer: Self-pay | Admitting: Family Medicine

## 2020-01-08 VITALS — BP 142/83 | HR 113 | Temp 98.4°F | Ht 74.0 in | Wt 346.0 lb

## 2020-01-08 DIAGNOSIS — Z9189 Other specified personal risk factors, not elsewhere classified: Secondary | ICD-10-CM | POA: Diagnosis not present

## 2020-01-08 DIAGNOSIS — K219 Gastro-esophageal reflux disease without esophagitis: Secondary | ICD-10-CM

## 2020-01-08 DIAGNOSIS — E65 Localized adiposity: Secondary | ICD-10-CM

## 2020-01-08 DIAGNOSIS — R632 Polyphagia: Secondary | ICD-10-CM | POA: Diagnosis not present

## 2020-01-08 DIAGNOSIS — R140 Abdominal distension (gaseous): Secondary | ICD-10-CM | POA: Diagnosis not present

## 2020-01-08 DIAGNOSIS — Z6841 Body Mass Index (BMI) 40.0 and over, adult: Secondary | ICD-10-CM

## 2020-01-08 MED ORDER — SAXENDA 18 MG/3ML ~~LOC~~ SOPN: 3.0000 mg | PEN_INJECTOR | Freq: Every day | SUBCUTANEOUS | 2 refills | Status: DC

## 2020-01-08 NOTE — Progress Notes (Signed)
Chief Complaint:   OBESITY Andre Perez is here to discuss his progress with his obesity treatment plan along with follow-up of his obesity related diagnoses.   Today's visit was #: 5 Starting weight: 353 lbs Starting date: 10/28/2019 Today's weight: 346 lbs Today's date: 01/14/2020 Total lbs lost to date: 7 lbs Body mass index is 44.42 kg/m.  Total weight loss percentage to date: -1.98%  Interim History: Dequann says that his sleep has improved.  He has been getting an average of 2500 calories per day this week and says he has increased his water intake.    Nutrition Plan: Category 4 plan for 75% of the time. Anti-obesity medications: Saxenda 1.8 mg subcutaneously daily. Reported side effects: None. Hunger is well controlled controlled. Cravings are well controlled controlled.  Activity: Sports 2 times per week. Sleep: Sleep is restful.   Assessment/Plan:   1. Polyphagia Kartik says, "for the first time in my life, I feel full".  Hyperphagia, also called polyphagia, refers to excessive feelings of hunger, which are not relieved by eating. He will continue to focus on protein-rich, low simple carbohydrate foods. We reviewed the importance of hydration, regular exercise for stress reduction, and restorative sleep.  2. Abdominal bloating Mild.  Reviewed bowel regimen.   Counseling Getting to Good Bowel Health: Your goal is to have one soft bowel movement each day. Drink at least 8 glasses of water each day. Eat plenty of fiber (goal is over 25 grams each day). It is best to get most of your fiber from dietary sources which includes leafy green vegetables, fresh fruit, and whole grains. You may need to add fiber with the help of OTC fiber supplements. These include Metamucil, Citrucel, and Benefiber. If you are still having trouble, try adding Miralax or Magnesium Citrate. If all of these changes do not work, contact me.  3. Visceral obesity Current visceral fat rating: 22.  Visceral adipose tissue is a hormonally active component of total body fat. This body composition phenotype is associated with medical disorders such as metabolic syndrome, cardiovascular disease and several malignancies including prostate, breast, and colorectal cancers. Starting goal: Lose 7-10% of starting weight. Visceral fat rating should be < 13.  4. Gastroesophageal reflux disease Stable.  Intensive lifestyle modifications are the first line treatment for this issue. We discussed several lifestyle modifications today and he will continue to work on diet, exercise and weight loss efforts. He is taking Protonix 40 mg daily as needed.  Plan:  Continue Protonix daily as needed and avoid triggering foods.  5. At risk for constipation Buel was given approximately 15 minutes of counseling today regarding prevention of constipation. He was encouraged to increase water and fiber intake.   6. Class 3 severe obesity with serious comorbidity and body mass index (BMI) of 40.0 to 44.9 in adult, unspecified obesity type [facility]) The current medical regimen is effective;  continue present plan and medications.  - Refill Liraglutide -Weight Management (SAXENDA) 18 MG/3ML SOPN; Inject 3 mg into the skin daily.  Dispense: 15 mL; Refill: 2  Laird is currently in the action stage of change. As such, his goal is to continue with weight loss efforts.   Nutrition goals: He has agreed to the Category 4 Plan.   Exercise goals: For substantial health benefits, adults should do at least 150 minutes (2 hours and 30 minutes) a week of moderate-intensity, or 75 minutes (1 hour and 15 minutes) a week of vigorous-intensity aerobic physical activity, or an equivalent  combination of moderate- and vigorous-intensity aerobic activity. Aerobic activity should be performed in episodes of at least 10 minutes, and preferably, it should be spread throughout the week.  Behavioral modification strategies: increasing lean protein  intake, decreasing simple carbohydrates, increasing vegetables and increasing water intake.  Dhillon has agreed to follow-up with our clinic in 3 weeks. He was informed of the importance of frequent follow-up visits to maximize his success with intensive lifestyle modifications for his multiple health conditions.   Objective:   Blood pressure (!) 142/83, pulse (!) 113, temperature 98.4 F (36.9 C), temperature source Oral, height 6\' 2"  (1.88 m), weight (!) 346 lb (156.9 kg), SpO2 96 %. Body mass index is 44.42 kg/m.  General: Cooperative, alert, well developed, in no acute distress. HEENT: Conjunctivae and lids unremarkable. Cardiovascular: Regular rhythm.  Lungs: Normal work of breathing. Neurologic: No focal deficits.   Attestation Statements:   Reviewed by clinician on day of visit: allergies, medications, problem list, medical history, surgical history, family history, social history, and previous encounter notes.  I, , CMA, am acting as transcriptionist for Insurance claims handler, DO  I have reviewed the above documentation for accuracy and completeness, and I agree with the above. Helane Rima, DO

## 2020-01-28 ENCOUNTER — Encounter (INDEPENDENT_AMBULATORY_CARE_PROVIDER_SITE_OTHER): Payer: Self-pay | Admitting: Family Medicine

## 2020-02-02 ENCOUNTER — Ambulatory Visit (INDEPENDENT_AMBULATORY_CARE_PROVIDER_SITE_OTHER): Payer: BC Managed Care – PPO | Admitting: Family Medicine

## 2020-02-02 ENCOUNTER — Other Ambulatory Visit: Payer: Self-pay

## 2020-02-02 ENCOUNTER — Encounter (INDEPENDENT_AMBULATORY_CARE_PROVIDER_SITE_OTHER): Payer: Self-pay | Admitting: Family Medicine

## 2020-02-02 VITALS — BP 127/88 | HR 93 | Temp 98.4°F | Ht 74.0 in | Wt 341.0 lb

## 2020-02-02 DIAGNOSIS — R632 Polyphagia: Secondary | ICD-10-CM

## 2020-02-02 DIAGNOSIS — E65 Localized adiposity: Secondary | ICD-10-CM

## 2020-02-02 DIAGNOSIS — F418 Other specified anxiety disorders: Secondary | ICD-10-CM | POA: Diagnosis not present

## 2020-02-02 DIAGNOSIS — Z6841 Body Mass Index (BMI) 40.0 and over, adult: Secondary | ICD-10-CM

## 2020-02-03 MED ORDER — SAXENDA 18 MG/3ML ~~LOC~~ SOPN: 3.0000 mg | PEN_INJECTOR | SUBCUTANEOUS | 0 refills | Status: DC

## 2020-02-03 NOTE — Progress Notes (Signed)
Chief Complaint:   OBESITY Andre Perez is here to discuss his progress with his obesity treatment plan along with follow-up of his obesity related diagnoses.   Today's visit was #: 6 Starting weight: 353 lbs Starting date: 10/28/2019 Today's weight: 341 lbs Today's date: 02/02/2020 Total lbs lost to date: 12 lbs Body mass index is 43.78 kg/m.  Total weight loss percentage to date: -3.40%  Interim History: Andre Perez says he is averaging 2400-2600 calories per day.  His sleep has improved, but his stress level is high, he says.  Nutrition Plan: the Category 4 Plan for 70% of the time.  Anti-obesity medications: Saxenda. Reported side effects: None. Hunger is well controlled controlled. Cravings are moderately controlled controlled.  Activity: Sports for 45 minutes 2 times per week. Sleep:  Improved. Stress: Level is high.  Assessment/Plan:   1. Polyphagia Improved on Saxenda. He will continue to focus on protein-rich, low simple carbohydrate foods. We reviewed the importance of hydration, regular exercise for stress reduction, and restorative sleep.  - Refill Liraglutide -Weight Management (SAXENDA) 18 MG/3ML SOPN; Inject 3 mg into the skin once a week.  Dispense: 15 mL; Refill: 0  2. Visceral obesity Current visceral fat rating: 22. Visceral fat rating should be < 13. Visceral adipose tissue is a hormonally active component of total body fat. This body composition phenotype is associated with medical disorders such as metabolic syndrome, cardiovascular disease and several malignancies including prostate, breast, and colorectal cancers. Starting goal: Lose 7-10% of starting weight.   3. Situational anxiety Discussed cues and consequences, how thoughts affect eating, model of thoughts, feelings, and behaviors, and strategies for change by focusing on the cue. Discussed cognitive distortions, coping thoughts, and how to change your thoughts.  4. Class 3 severe obesity with serious  comorbidity and body mass index (BMI) of 40.0 to 44.9 in adult, unspecified obesity type (HCC)  Course: Andre Perez is currently in the action stage of change. As such, his goal is to continue with weight loss efforts.   Nutrition goals: He has agreed to the Category 4 Plan.   Exercise goals: For substantial health benefits, adults should do at least 150 minutes (2 hours and 30 minutes) a week of moderate-intensity, or 75 minutes (1 hour and 15 minutes) a week of vigorous-intensity aerobic physical activity, or an equivalent combination of moderate- and vigorous-intensity aerobic activity. Aerobic activity should be performed in episodes of at least 10 minutes, and preferably, it should be spread throughout the week.  Behavioral modification strategies: increasing lean protein intake, decreasing simple carbohydrates, increasing vegetables and increasing water intake.  Andre Perez has agreed to follow-up with our clinic in 3-4 weeks. He was informed of the importance of frequent follow-up visits to maximize his success with intensive lifestyle modifications for his multiple health conditions.   Objective:   Blood pressure 127/88, pulse 93, temperature 98.4 F (36.9 C), height 6\' 2"  (1.88 m), weight (!) 341 lb (154.7 kg), SpO2 98 %. Body mass index is 43.78 kg/m.  General: Cooperative, alert, well developed, in no acute distress. HEENT: Conjunctivae and lids unremarkable. Cardiovascular: Regular rhythm.  Lungs: Normal work of breathing. Neurologic: No focal deficits.   Lab Results  Component Value Date   INSULIN 23.9 10/28/2019   Attestation Statements:   Reviewed by clinician on day of visit: allergies, medications, problem list, medical history, surgical history, family history, social history, and previous encounter notes.  I, 12/28/2019, CMA, am acting as transcriptionist for Insurance claims handler, DO  I  have reviewed the above documentation for accuracy and completeness, and I agree with the  above. Helane Rima, DO

## 2020-02-24 ENCOUNTER — Other Ambulatory Visit: Payer: Self-pay

## 2020-02-24 ENCOUNTER — Ambulatory Visit (INDEPENDENT_AMBULATORY_CARE_PROVIDER_SITE_OTHER): Payer: BC Managed Care – PPO | Admitting: Family Medicine

## 2020-02-24 ENCOUNTER — Encounter (INDEPENDENT_AMBULATORY_CARE_PROVIDER_SITE_OTHER): Payer: Self-pay | Admitting: Family Medicine

## 2020-02-24 VITALS — BP 124/84 | HR 97 | Temp 97.7°F | Ht 74.0 in | Wt 340.0 lb

## 2020-02-24 DIAGNOSIS — E8881 Metabolic syndrome: Secondary | ICD-10-CM | POA: Diagnosis not present

## 2020-02-24 DIAGNOSIS — Z6841 Body Mass Index (BMI) 40.0 and over, adult: Secondary | ICD-10-CM

## 2020-02-24 DIAGNOSIS — G4709 Other insomnia: Secondary | ICD-10-CM | POA: Diagnosis not present

## 2020-02-24 DIAGNOSIS — E65 Localized adiposity: Secondary | ICD-10-CM | POA: Diagnosis not present

## 2020-02-24 DIAGNOSIS — Z9189 Other specified personal risk factors, not elsewhere classified: Secondary | ICD-10-CM

## 2020-02-24 DIAGNOSIS — R632 Polyphagia: Secondary | ICD-10-CM | POA: Diagnosis not present

## 2020-02-24 MED ORDER — SAXENDA 18 MG/3ML ~~LOC~~ SOPN: 3.0000 mg | PEN_INJECTOR | Freq: Every day | SUBCUTANEOUS | 0 refills | Status: DC

## 2020-02-24 MED ORDER — INSULIN PEN NEEDLE 32G X 4 MM MISC: 1.0000 | Freq: Every day | 0 refills | Status: DC

## 2020-02-24 MED ORDER — TRAZODONE HCL 50 MG PO TABS: 50.0000 mg | ORAL_TABLET | Freq: Every day | ORAL | 0 refills | Status: DC

## 2020-02-25 NOTE — Progress Notes (Signed)
Chief Complaint:   OBESITY Andre Perez is here to discuss his progress with his obesity treatment plan along with follow-up of his obesity related diagnoses.   Today's visit was #: 7 Starting weight: 353 lbs Starting date: 10/28/2019 Today's weight: 340 lbs Today's date: 02/24/2020 Total lbs lost to date: 13 lbs Body mass index is 43.65 kg/m.  Total weight loss percentage to date: -3.68%  Interim History: Andre Perez says that everyone is feeling better after previously being sick.  His insurance may not cover AOM next year, he says.   Nutrition Plan: the Category 4 Plan for 55% of the time.  Anti-obesity medications: Saxenda 3 mg subcutaneously daily.  Hunger is well controlled. Cravings are well controlled.  Activity: Playing sports for 30-45 minutes 2 times per week. Sleep: Number of hours slept each night: ~6. Sleep is not restful.  Sleep is disrupted and he wakes in the early morning.  Assessment/Plan:   1. Insulin resistance Improving, but not optimized. Goal is HgbA1c < 5.7, fasting insulin closer to 5.  He will continue to focus on protein-rich, low simple carbohydrate foods. We reviewed the importance of hydration, regular exercise for stress reduction, and restorative sleep.   Lab Results  Component Value Date   INSULIN 23.9 10/28/2019   2. Polyphagia Improved with treatment. He will continue to focus on protein-rich, low simple carbohydrate foods. We reviewed the importance of hydration, regular exercise for stress reduction, and restorative sleep.  - Refill Liraglutide -Weight Management (SAXENDA) 18 MG/3ML SOPN; Inject 3 mg into the skin daily.  Dispense: 45 mL; Refill: 0 - Refill Insulin Pen Needle 32G X 4 MM MISC; 1 each by Does not apply route daily.  Dispense: 100 each; Refill: 0  3. Visceral obesity Current visceral fat rating: 21. Visceral fat rating should be < 13. Visceral adipose tissue is a hormonally active component of total body fat. This body composition  phenotype is associated with medical disorders such as metabolic syndrome, cardiovascular disease and several malignancies including prostate, breast, and colorectal cancers. Starting goal: Lose 7-10% of starting weight.   4. Other insomnia This is poorly controlled. Dysfunction: early morning awakening and non-restful sleep. Average hours of sleep per night: 6.   Plan: Recommend sleep hygiene measures including regular sleep schedule, optimal sleep environment, and relaxing presleep rituals.   - Start traZODone (DESYREL) 50 MG tablet; Take 1 tablet (50 mg total) by mouth at bedtime.  Dispense: 30 tablet; Refill: 0  5. At risk for heart disease Andre Perez was given approximately 9 minutes of coronary artery disease prevention counseling today. He is 34 y.o. male and has risk factors for heart disease including obesity and metabolic syndrome/visceral obesity. We discussed intensive lifestyle modifications today with an emphasis on specific weight loss instructions and strategies. Repetitive spaced learning was employed today to elicit superior memory formation and behavioral change.  6. Class 3 severe obesity with serious comorbidity and body mass index (BMI) of 40.0 to 44.9 in adult, unspecified obesity type (HCC)  Course: Andre Perez is currently in the action stage of change. As such, his goal is to continue with weight loss efforts.   Nutrition goals: He has agreed to the Category 4 Plan.   Exercise goals: For substantial health benefits, adults should do at least 150 minutes (2 hours and 30 minutes) a week of moderate-intensity, or 75 minutes (1 hour and 15 minutes) a week of vigorous-intensity aerobic physical activity, or an equivalent combination of moderate- and vigorous-intensity aerobic activity. Aerobic  activity should be performed in episodes of at least 10 minutes, and preferably, it should be spread throughout the week.  Behavioral modification strategies: increasing lean protein intake,  decreasing simple carbohydrates, increasing vegetables and increasing water intake.  Andre Perez has agreed to follow-up with our clinic in 4 weeks. He was informed of the importance of frequent follow-up visits to maximize his success with intensive lifestyle modifications for his multiple health conditions.   Objective:   Blood pressure 124/84, pulse 97, temperature 97.7 F (36.5 C), temperature source Oral, height 6\' 2"  (1.88 m), weight (!) 340 lb (154.2 kg), SpO2 95 %. Body mass index is 43.65 kg/m.  General: Cooperative, alert, well developed, in no acute distress. HEENT: Conjunctivae and lids unremarkable. Cardiovascular: Regular rhythm.  Lungs: Normal work of breathing. Neurologic: No focal deficits.   Lab Results  Component Value Date   INSULIN 23.9 10/28/2019   Attestation Statements:   Reviewed by clinician on day of visit: allergies, medications, problem list, medical history, surgical history, family history, social history, and previous encounter notes.  I, 12/28/2019, CMA, am acting as transcriptionist for Insurance claims handler, DO  I have reviewed the above documentation for accuracy and completeness, and I agree with the above. Helane Rima, DO

## 2020-02-27 ENCOUNTER — Other Ambulatory Visit: Payer: Self-pay

## 2020-02-27 DIAGNOSIS — R632 Polyphagia: Secondary | ICD-10-CM

## 2020-02-27 MED ORDER — SAXENDA 18 MG/3ML ~~LOC~~ SOPN: 3.0000 mg | PEN_INJECTOR | Freq: Every day | SUBCUTANEOUS | 0 refills | Status: DC

## 2020-03-24 ENCOUNTER — Other Ambulatory Visit: Payer: Self-pay

## 2020-03-24 ENCOUNTER — Encounter (INDEPENDENT_AMBULATORY_CARE_PROVIDER_SITE_OTHER): Payer: Self-pay | Admitting: Family Medicine

## 2020-03-24 ENCOUNTER — Ambulatory Visit (INDEPENDENT_AMBULATORY_CARE_PROVIDER_SITE_OTHER): Payer: BC Managed Care – PPO | Admitting: Family Medicine

## 2020-03-24 VITALS — BP 143/85 | HR 111 | Temp 98.2°F | Ht 74.0 in | Wt 339.0 lb

## 2020-03-24 DIAGNOSIS — Z6841 Body Mass Index (BMI) 40.0 and over, adult: Secondary | ICD-10-CM

## 2020-03-24 DIAGNOSIS — R632 Polyphagia: Secondary | ICD-10-CM

## 2020-03-24 DIAGNOSIS — G4733 Obstructive sleep apnea (adult) (pediatric): Secondary | ICD-10-CM

## 2020-03-24 DIAGNOSIS — Z9189 Other specified personal risk factors, not elsewhere classified: Secondary | ICD-10-CM | POA: Diagnosis not present

## 2020-03-24 DIAGNOSIS — E8881 Metabolic syndrome: Secondary | ICD-10-CM | POA: Diagnosis not present

## 2020-03-25 ENCOUNTER — Other Ambulatory Visit (INDEPENDENT_AMBULATORY_CARE_PROVIDER_SITE_OTHER): Payer: Self-pay | Admitting: Family Medicine

## 2020-03-25 DIAGNOSIS — G4709 Other insomnia: Secondary | ICD-10-CM

## 2020-03-25 NOTE — Telephone Encounter (Signed)
Dr.Wallace °

## 2020-03-30 NOTE — Progress Notes (Signed)
Chief Complaint:   OBESITY Andre Perez is here to discuss his progress with his obesity treatment plan along with follow-up of his obesity related diagnoses.   Today's visit was #: 8 Starting weight: 353 lbs Starting date: 10/28/2019 Today's weight: 339 lbs Today's date: 03/24/2020 Total lbs lost to date: 14 lbs Body mass index is 43.53 kg/m.  Total weight loss percentage to date: -3.97%  Interim History: Andre Perez says he has been under some stress at work, but it is "somewhat manageable". Nutrition Plan: the Category 4 Plan for 55% of the time.  Anti-obesity medications: Saxenda 3 mg subcutaneously daily. Reported side effects: None. 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 Stress: Slightly elevated at work, but he says it is manageable. .  Assessment/Plan:   1. Insulin resistance Improving, but not optimized. Goal is HgbA1c < 5.7, fasting insulin closer to 5.  Medication: liraglutide.  He will continue to focus on protein-rich, low simple carbohydrate foods. We reviewed the importance of hydration, regular exercise for stress reduction, and restorative sleep.   No results found for: HGBA1C Lab Results  Component Value Date   INSULIN 23.9 10/28/2019   2. Polyphagia Improving. Andre Perez is taking Saxenda 3 mg subcutaneously daily. He will continue to focus on protein-rich, low simple carbohydrate foods. We reviewed the importance of hydration, regular exercise for stress reduction, and restorative sleep.  3. OSA (obstructive sleep apnea) OSA is a cause of systemic hypertension and is associated with an increased incidence of stroke, heart failure, atrial fibrillation, and coronary heart disease.   Goal: Treatment of OSA via CPAP compliance and weight loss. . Plasma ghrelin levels (appetite or "hunger hormone") are significantly higher in OSA patients than in BMI-matched controls, but decrease to levels similar to those of obese patients without OSA after CPAP treatment.   . Weight loss improves OSA by several mechanisms, including reduction in fatty tissue in the throat (i.e. parapharyngeal fat) and the tongue. Loss of abdominal fat increases mediastinal traction on the upper airway making it less likely to collapse during sleep. . Studies have also shown that compliance with CPAP treatment improves leptin (hunger inhibitory hormone) imbalance.  4. At risk for heart disease Andre Perez was given approximately 8 minutes of coronary artery disease prevention counseling today. He is 35 y.o. male and has risk factors for heart disease including obesity and OA. We discussed intensive lifestyle modifications today with an emphasis on specific weight loss instructions and strategies. Repetitive spaced learning was employed today to elicit superior memory formation and behavioral change.  5. Class 3 severe obesity with serious comorbidity and body mass index (BMI) of 40.0 to 44.9 in adult, unspecified obesity type (HCC)  Course: Andre Perez is currently in the action stage of change. As such, his goal is to continue with weight loss efforts.   Nutrition goals: He has agreed to the Category 4 Plan.   Exercise goals: For substantial health benefits, adults should do at least 150 minutes (2 hours and 30 minutes) a week of moderate-intensity, or 75 minutes (1 hour and 15 minutes) a week of vigorous-intensity aerobic physical activity, or an equivalent combination of moderate- and vigorous-intensity aerobic activity. Aerobic activity should be performed in episodes of at least 10 minutes, and preferably, it should be spread throughout the week.  Behavioral modification strategies: increasing lean protein intake, decreasing simple carbohydrates, increasing vegetables, increasing water intake and decreasing liquid calories.  Andre Perez has agreed to follow-up with our clinic in 4 weeks. He was informed of  the importance of frequent follow-up visits to maximize his success with intensive  lifestyle modifications for his multiple health conditions.   Objective:   Blood pressure (!) 143/85, pulse (!) 111, temperature 98.2 F (36.8 C), temperature source Oral, height 6\' 2"  (1.88 m), weight (!) 339 lb (153.8 kg), SpO2 97 %. Body mass index is 43.53 kg/m.  General: Cooperative, alert, well developed, in no acute distress. HEENT: Conjunctivae and lids unremarkable. Cardiovascular: Regular rhythm.  Lungs: Normal work of breathing. Neurologic: No focal deficits.   Lab Results  Component Value Date   INSULIN 23.9 10/28/2019   Attestation Statements:   Reviewed by clinician on day of visit: allergies, medications, problem list, medical history, surgical history, family history, social history, and previous encounter notes.  I, 12/28/2019, CMA, am acting as transcriptionist for Insurance claims handler, DO  I have reviewed the above documentation for accuracy and completeness, and I agree with the above. Helane Rima, DO

## 2020-04-12 ENCOUNTER — Encounter (INDEPENDENT_AMBULATORY_CARE_PROVIDER_SITE_OTHER): Payer: Self-pay | Admitting: Family Medicine

## 2020-04-13 NOTE — Telephone Encounter (Signed)
Last OV with Dr Wallace 

## 2020-04-14 ENCOUNTER — Encounter (INDEPENDENT_AMBULATORY_CARE_PROVIDER_SITE_OTHER): Payer: Self-pay | Admitting: Family Medicine

## 2020-04-14 ENCOUNTER — Telehealth (INDEPENDENT_AMBULATORY_CARE_PROVIDER_SITE_OTHER): Payer: Self-pay

## 2020-04-14 NOTE — Telephone Encounter (Signed)
A Prior Authorization has been initiated via [email or web address] for Saxenda 18mg /69mL injection.   1m ([facility]illa Rough) Rx #: Virl Son Saxenda 18MG 03-04-1994 pen-injectors   Form [employer] [place] [employer] Electronic Request Form (CB) Created 1 day ago Sent to Plan 3 minutes ago Plan Response 3 minutes ago Submit Clinical Questions less than a minute ago Determination Wait for Determination Please wait for [employer] [place] [employer] cb central to return a determination.    (Key: [id number])  Your information has been submitted to [facility] [place]. [employer] [place] will review the request and notify you of the determination decision directly, typically within 72 hours of receiving all information.  You will also receive your request decision electronically. To check for an update later, open this request again from your dashboard.  If Andre Perez has not responded within the specified timeframe or if you have any questions about your PA submission, contact [employer] [place] directly at [phone].

## 2020-04-19 ENCOUNTER — Encounter (INDEPENDENT_AMBULATORY_CARE_PROVIDER_SITE_OTHER): Payer: Self-pay | Admitting: Family Medicine

## 2020-04-19 ENCOUNTER — Other Ambulatory Visit: Payer: Self-pay

## 2020-04-19 ENCOUNTER — Ambulatory Visit (INDEPENDENT_AMBULATORY_CARE_PROVIDER_SITE_OTHER): Payer: BC Managed Care – PPO | Admitting: Family Medicine

## 2020-04-19 VITALS — BP 127/88 | HR 90 | Temp 98.2°F | Ht 74.0 in | Wt 331.0 lb

## 2020-04-19 DIAGNOSIS — E8881 Metabolic syndrome: Secondary | ICD-10-CM | POA: Diagnosis not present

## 2020-04-19 DIAGNOSIS — E786 Lipoprotein deficiency: Secondary | ICD-10-CM | POA: Insufficient documentation

## 2020-04-19 DIAGNOSIS — K219 Gastro-esophageal reflux disease without esophagitis: Secondary | ICD-10-CM | POA: Insufficient documentation

## 2020-04-19 DIAGNOSIS — G4709 Other insomnia: Secondary | ICD-10-CM | POA: Diagnosis not present

## 2020-04-19 DIAGNOSIS — G4733 Obstructive sleep apnea (adult) (pediatric): Secondary | ICD-10-CM | POA: Insufficient documentation

## 2020-04-19 DIAGNOSIS — E65 Localized adiposity: Secondary | ICD-10-CM

## 2020-04-19 DIAGNOSIS — Z9189 Other specified personal risk factors, not elsewhere classified: Secondary | ICD-10-CM

## 2020-04-19 DIAGNOSIS — Z6841 Body Mass Index (BMI) 40.0 and over, adult: Secondary | ICD-10-CM

## 2020-04-19 DIAGNOSIS — N62 Hypertrophy of breast: Secondary | ICD-10-CM | POA: Insufficient documentation

## 2020-04-19 MED ORDER — SAXENDA 18 MG/3ML ~~LOC~~ SOPN: 3.0000 mg | PEN_INJECTOR | Freq: Every day | SUBCUTANEOUS | 0 refills | Status: DC

## 2020-04-19 NOTE — Progress Notes (Signed)
Chief Complaint:   OBESITY Andre Perez is here to discuss his progress with his obesity treatment plan along with follow-up of his obesity related diagnoses.   Today's visit was #: 9 Starting weight: 353 lbs Starting date: 10/28/2019 Today's weight: 331 lbs Today's date: 04/19/2020 Total lbs lost to date: 22 lbs Body mass index is 42.5 kg/m.  Total weight loss percentage to date: -6.23%  Interim History: Andre Perez says he has been using Nutrisystem meals on very busy days. This works well for him, taking decision-making out.  Nutrition Plan: the Category 4 Plan for 65% of the time.  Anti-obesity medications: . Reported side effects: Saxenda 3 mg subcutaneously daily. Activity: Sports for 45 minutes 2 times per week.  Assessment/Plan:   1. Metabolic syndrome Starting goal: Lose 7-10% of starting weight. He will continue to focus on protein-rich, low simple carbohydrate foods. We reviewed the importance of hydration, regular exercise for stress reduction, and restorative sleep.  We will continue to check lab work every 3 months, with 10% weight loss, or should any other concerns arise.  - Refill Liraglutide -Weight Management (SAXENDA) 18 MG/3ML SOPN; Inject 3 mg into the skin daily.  Dispense: 45 mL; Refill: 0  2. Visceral obesity Current visceral fat rating: 21. Visceral fat rating should be < 13. Visceral adipose tissue is a hormonally active component of total body fat. This body composition phenotype is associated with medical disorders such as metabolic syndrome, cardiovascular disease and several malignancies including prostate, breast, and colorectal cancers. Starting goal: Lose 7-10% of starting weight.   3. Other insomnia This is improving. Plan: Recommend sleep hygiene measures including regular sleep schedule, optimal sleep environment, and relaxing presleep rituals. Medication: Trazodone 25 mg twice per week or prn.   4. At risk for heart disease Due to Andre Perez's current  state of health and medical condition(s), he is at a higher risk for heart disease.   This puts the patient at much greater risk to subsequently develop cardiopulmonary conditions that can significantly affect patient's quality of life in a negative manner as well.    At least 9 minutes was spent on counseling Andre Perez about these concerns today. Initial goal is to lose at least 5-10% of starting weight to help reduce these risk factors.  We will continue to reassess these conditions on a fairly regular basis in an attempt to decrease patient's overall morbidity and mortality.  Evidence-based interventions for health behavior change were utilized today including the discussion of self monitoring techniques, problem-solving barriers and SMART goal setting techniques.  Specifically regarding patient's less desirable eating habits and patterns, we employed the technique of small changes when Andre Perez has not been able to fully commit to his prudent nutritional plan.  5. Class 3 severe obesity with serious comorbidity and body mass index (BMI) of 40.0 to 44.9 in adult, unspecified obesity type (HCC)  Course: Andre Perez is currently in the action stage of change. As such, his goal is to continue with weight loss efforts.   Nutrition goals: He has agreed to the Category 4 Plan.   Exercise goals: For substantial health benefits, adults should do at least 150 minutes (2 hours and 30 minutes) a week of moderate-intensity, or 75 minutes (1 hour and 15 minutes) a week of vigorous-intensity aerobic physical activity, or an equivalent combination of moderate- and vigorous-intensity aerobic activity. Aerobic activity should be performed in episodes of at least 10 minutes, and preferably, it should be spread throughout the week.  Behavioral modification  strategies: increasing lean protein intake, decreasing simple carbohydrates, increasing vegetables and increasing water intake.  Andre Perez has agreed to follow-up with our  clinic in 4 weeks. He was informed of the importance of frequent follow-up visits to maximize his success with intensive lifestyle modifications for his multiple health conditions.   Objective:   Blood pressure 127/88, pulse 90, temperature 98.2 F (36.8 C), temperature source Oral, height 6\' 2"  (1.88 m), weight (!) 331 lb (150.1 kg), SpO2 98 %. Body mass index is 42.5 kg/m.  General: Cooperative, alert, well developed, in no acute distress. HEENT: Conjunctivae and lids unremarkable. Cardiovascular: Regular rhythm.  Lungs: Normal work of breathing. Neurologic: No focal deficits.   Lab Results  Component Value Date   INSULIN 23.9 10/28/2019   Attestation Statements:   Reviewed by clinician on day of visit: allergies, medications, problem list, medical history, surgical history, family history, social history, and previous encounter notes.  I, 12/28/2019, CMA, am acting as transcriptionist for Insurance claims handler, DO  I have reviewed the above documentation for accuracy and completeness, and I agree with the above. Helane Rima, DO

## 2020-05-17 ENCOUNTER — Ambulatory Visit (INDEPENDENT_AMBULATORY_CARE_PROVIDER_SITE_OTHER): Payer: BC Managed Care – PPO | Admitting: Family Medicine

## 2020-05-17 ENCOUNTER — Encounter (INDEPENDENT_AMBULATORY_CARE_PROVIDER_SITE_OTHER): Payer: Self-pay | Admitting: Family Medicine

## 2020-05-17 ENCOUNTER — Other Ambulatory Visit: Payer: Self-pay

## 2020-05-17 VITALS — BP 127/81 | HR 95 | Temp 97.9°F | Ht 74.0 in | Wt 331.0 lb

## 2020-05-17 DIAGNOSIS — E8881 Metabolic syndrome: Secondary | ICD-10-CM

## 2020-05-17 DIAGNOSIS — Z9189 Other specified personal risk factors, not elsewhere classified: Secondary | ICD-10-CM

## 2020-05-17 DIAGNOSIS — R632 Polyphagia: Secondary | ICD-10-CM | POA: Diagnosis not present

## 2020-05-17 DIAGNOSIS — M25511 Pain in right shoulder: Secondary | ICD-10-CM | POA: Diagnosis not present

## 2020-05-17 DIAGNOSIS — R5383 Other fatigue: Secondary | ICD-10-CM | POA: Diagnosis not present

## 2020-05-17 DIAGNOSIS — E88819 Insulin resistance, unspecified: Secondary | ICD-10-CM

## 2020-05-17 DIAGNOSIS — Z6841 Body Mass Index (BMI) 40.0 and over, adult: Secondary | ICD-10-CM

## 2020-05-17 MED ORDER — INSULIN PEN NEEDLE 32G X 4 MM MISC: 1.0000 | Freq: Every day | 0 refills | Status: DC

## 2020-05-18 LAB — COMPREHENSIVE METABOLIC PANEL
ALT: 18 IU/L (ref 0–44)
AST: 16 IU/L (ref 0–40)
Albumin/Globulin Ratio: 1.8 (ref 1.2–2.2)
Albumin: 4.6 g/dL (ref 4.0–5.0)
Alkaline Phosphatase: 88 IU/L (ref 44–121)
BUN/Creatinine Ratio: 14 (ref 9–20)
BUN: 13 mg/dL (ref 6–20)
Bilirubin Total: 0.3 mg/dL (ref 0.0–1.2)
CO2: 21 mmol/L (ref 20–29)
Calcium: 9.6 mg/dL (ref 8.7–10.2)
Chloride: 104 mmol/L (ref 96–106)
Creatinine, Ser: 0.9 mg/dL (ref 0.76–1.27)
Globulin, Total: 2.5 g/dL (ref 1.5–4.5)
Glucose: 79 mg/dL (ref 65–99)
Potassium: 4.5 mmol/L (ref 3.5–5.2)
Sodium: 142 mmol/L (ref 134–144)
Total Protein: 7.1 g/dL (ref 6.0–8.5)
eGFR: 113 mL/min/{1.73_m2} (ref >59)

## 2020-05-18 LAB — CBC WITH DIFFERENTIAL/PLATELET
Basophils Absolute: 0.1 10*3/uL (ref 0.0–0.2)
Basos: 1 %
EOS (ABSOLUTE): 0.2 10*3/uL (ref 0.0–0.4)
Eos: 2 %
Hematocrit: 46.2 % (ref 37.5–51.0)
Hemoglobin: 15.6 g/dL (ref 13.0–17.7)
Immature Grans (Abs): 0 10*3/uL (ref 0.0–0.1)
Immature Granulocytes: 0 %
Lymphocytes Absolute: 3 10*3/uL (ref 0.7–3.1)
Lymphs: 31 %
MCH: 30.6 pg (ref 26.6–33.0)
MCHC: 33.8 g/dL (ref 31.5–35.7)
MCV: 91 fL (ref 79–97)
Monocytes Absolute: 0.7 10*3/uL (ref 0.1–0.9)
Monocytes: 7 %
Neutrophils Absolute: 5.5 10*3/uL (ref 1.4–7.0)
Neutrophils: 59 %
Platelets: 277 10*3/uL (ref 150–450)
RBC: 5.09 x10E6/uL (ref 4.14–5.80)
RDW: 12.9 % (ref 11.6–15.4)
WBC: 9.4 10*3/uL (ref 3.4–10.8)

## 2020-05-18 LAB — LIPID PANEL
Chol/HDL Ratio: 3.9 ratio (ref 0.0–5.0)
Cholesterol, Total: 153 mg/dL (ref 100–199)
HDL: 39 mg/dL — ABNORMAL LOW (ref >39)
LDL Chol Calc (NIH): 94 mg/dL (ref 0–99)
Triglycerides: 110 mg/dL (ref 0–149)
VLDL Cholesterol Cal: 20 mg/dL (ref 5–40)

## 2020-05-18 LAB — TSH: TSH: 1.3 u[IU]/mL (ref 0.450–4.500)

## 2020-05-18 LAB — TESTOSTERONE,FREE AND TOTAL
Testosterone, Free: 11.9 pg/mL (ref 8.7–25.1)
Testosterone: 243 ng/dL — ABNORMAL LOW (ref 264–916)

## 2020-05-18 LAB — INSULIN, RANDOM: INSULIN: 26.1 u[IU]/mL — ABNORMAL HIGH (ref 2.6–24.9)

## 2020-05-18 LAB — VITAMIN D 25 HYDROXY (VIT D DEFICIENCY, FRACTURES): Vit D, 25-Hydroxy: 28 ng/mL — ABNORMAL LOW (ref 30.0–100.0)

## 2020-05-18 LAB — VITAMIN B12: Vitamin B-12: 767 pg/mL (ref 232–1245)

## 2020-05-18 MED ORDER — WEGOVY 1 MG/0.5ML ~~LOC~~ SOAJ
1.0000 mg | SUBCUTANEOUS | 0 refills | Status: DC
Start: 2020-05-18 — End: 2020-06-21

## 2020-05-18 NOTE — Progress Notes (Signed)
Chief Complaint:   OBESITY Andre Perez is here to discuss his progress with his obesity treatment plan along with follow-up of his obesity related diagnoses.   Today's visit was #: 10 Starting weight: 353 lbs Starting date: 10/28/2019 Today's weight: 331 lbs Today's date: 05/17/2020 Total lbs lost to date: 22 lbs Body mass index is 42.5 kg/m.  Total weight loss percentage to date: -6.23%  Interim History:  Andre Perez has hit a plateau.  He says his family has been sick again since his last visit.  He had some sweets on and after Valentine's day.  He reports doing less exercise. Current Meal Plan: the Category 4 Plan for 65% of the time.  Current Exercise Plan: Sports for 45 minutes 2 times per week. Current Anti-Obesity Medications: Saxenda 3 mg subcutaneously daily. Side effects: None.  Assessment/Plan:   1. Polyphagia Current treatment: Saxenda 3 mg subcutaneously daily. Polyphagia refers to excessive feelings of hunger. He will continue to focus on protein-rich, low simple carbohydrate foods. We reviewed the importance of hydration, regular exercise for stress reduction, and restorative sleep.  - Refill Insulin Pen Needle 32G X 4 MM MISC; 1 each by Does not apply route daily.  Dispense: 100 each; Refill: 0 - CBC with Differential/Platelet - Lipid panel - Start Semaglutide-Weight Management (WEGOVY) 1 MG/0.5ML SOAJ; Inject 1 mg into the skin once a week.  Dispense: 2 mL; Refill: 0  2. Insulin resistance Not at goal. Goal is HgbA1c < 5.7, fasting insulin closer to 5.  Medication: Saxenda 3 mg subcutaneously daily.    Plan:  He will continue to focus on protein-rich, low simple carbohydrate foods. We reviewed the importance of hydration, regular exercise for stress reduction, and restorative sleep.   Lab Results  Component Value Date   INSULIN 23.9 10/28/2019   - Comprehensive metabolic panel - Insulin, random  3. Right shoulder pain, unspecified chronicity Will place  referral to [facility].  4. Other fatigue Andre Perez endorses increased fatigue.  Will check labs today, as per below.  - CBC with Differential/Platelet - Lipid panel - VITAMIN D 25 Hydroxy (Vit-D Deficiency, Fractures) - Vitamin B12 - TSH - Testosterone,Free and Total  5. Metabolic syndrome Starting goal: Lose 7-10% of starting weight. He will continue to focus on protein-rich, low simple carbohydrate foods. We reviewed the importance of hydration, regular exercise for stress reduction, and restorative sleep.  We will continue to check lab work every 3 months, with 10% weight loss, or should any other concerns arise.  Current visceral fat rating: 20. Visceral fat rating should be < 13. Visceral adipose tissue is a hormonally active component of total body fat. This body composition phenotype is associated with medical disorders such as metabolic syndrome, cardiovascular disease and several malignancies including prostate, breast, and colorectal cancers. Starting goal: Lose 7-10% of starting weight.   6. At risk for heart disease Due to Andre Perez's current state of health and medical condition(s), he is at a higher risk for heart disease.  This puts the patient at much greater risk to subsequently develop cardiopulmonary conditions that can significantly affect patient's quality of life in a negative manner.    At least 8 minutes were spent on counseling Andre Perez about these concerns today, and I stressed the importance of reversing risks factors of obesity, especially truncal and visceral fat, hypertension, hyperlipidemia, and pre-diabetes.  The initial goal is to lose at least 5-10% of starting weight to help reduce these risk factors.  Counseling:  Intensive  lifestyle modifications were discussed with Andre Perez as the most appropriate first line of treatment.  he will continue to work on diet, exercise, and weight loss efforts.  We will continue to reassess these conditions on a fairly regular basis  in an attempt to decrease the patient's overall morbidity and mortality.  Evidence-based interventions for health behavior change were utilized today including the discussion of self monitoring techniques, problem-solving barriers, and SMART goal setting techniques.  Specifically, regarding patient's less desirable eating habits and patterns, we employed the technique of small changes when Andre Perez has not been able to fully commit to his prudent nutritional plan.  7. Class 3 severe obesity with serious comorbidity and body mass index (BMI) of 40.0 to 44.9 in adult, unspecified obesity type (HCC)  Course: Andre Perez is currently in the action stage of change. As such, his goal is to continue with weight loss efforts.   Nutrition goals: He has agreed to the Category 4 Plan.   Exercise goals: For substantial health benefits, adults should do at least 150 minutes (2 hours and 30 minutes) a week of moderate-intensity, or 75 minutes (1 hour and 15 minutes) a week of vigorous-intensity aerobic physical activity, or an equivalent combination of moderate- and vigorous-intensity aerobic activity. Aerobic activity should be performed in episodes of at least 10 minutes, and preferably, it should be spread throughout the week.  Behavioral modification strategies: increasing lean protein intake, decreasing simple carbohydrates, increasing vegetables and increasing water intake.  Andre Perez has agreed to follow-up with our clinic in 5 weeks. He was informed of the importance of frequent follow-up visits to maximize his success with intensive lifestyle modifications for his multiple health conditions.   Andre Perez was informed we would discuss his lab results at his next visit unless there is a critical issue that needs to be addressed sooner. Andre Perez agreed to keep his next visit at the agreed upon time to discuss these results.  Objective:   Blood pressure 127/81, pulse 95, temperature 97.9 F (36.6 C), temperature  source Oral, height 6\' 2"  (1.88 m), weight (!) 331 lb (150.1 kg), SpO2 97 %. Body mass index is 42.5 kg/m.  General: Cooperative, alert, well developed, in no acute distress. HEENT: Conjunctivae and lids unremarkable. Cardiovascular: Regular rhythm.  Lungs: Normal work of breathing. Neurologic: No focal deficits.   Lab Results  Component Value Date   INSULIN 23.9 10/28/2019   Attestation Statements:   Reviewed by clinician on day of visit: allergies, medications, problem list, medical history, surgical history, family history, social history, and previous encounter notes.  I, 12/28/2019, CMA, am acting as transcriptionist for Insurance claims handler, DO  I have reviewed the above documentation for accuracy and completeness, and I agree with the above. Helane Rima, DO

## 2020-05-25 ENCOUNTER — Telehealth (INDEPENDENT_AMBULATORY_CARE_PROVIDER_SITE_OTHER): Payer: Self-pay | Admitting: Family Medicine

## 2020-05-25 NOTE — Telephone Encounter (Signed)
[employer] called regarding prior authorization appeal for [facility]. Phone number is [phone] and reference number is [id number].

## 2020-05-26 NOTE — Telephone Encounter (Signed)
Appeal done...

## 2020-06-17 ENCOUNTER — Encounter (INDEPENDENT_AMBULATORY_CARE_PROVIDER_SITE_OTHER): Payer: Self-pay | Admitting: Family Medicine

## 2020-06-17 DIAGNOSIS — E8881 Metabolic syndrome: Secondary | ICD-10-CM

## 2020-06-17 MED ORDER — SAXENDA 18 MG/3ML ~~LOC~~ SOPN: 3.0000 mg | PEN_INJECTOR | Freq: Every day | SUBCUTANEOUS | 0 refills | Status: DC

## 2020-06-21 ENCOUNTER — Encounter (INDEPENDENT_AMBULATORY_CARE_PROVIDER_SITE_OTHER): Payer: Self-pay | Admitting: Family Medicine

## 2020-06-21 ENCOUNTER — Other Ambulatory Visit: Payer: Self-pay

## 2020-06-21 ENCOUNTER — Ambulatory Visit (INDEPENDENT_AMBULATORY_CARE_PROVIDER_SITE_OTHER): Payer: BC Managed Care – PPO | Admitting: Family Medicine

## 2020-06-21 VITALS — BP 141/84 | HR 94 | Temp 98.1°F | Ht 74.0 in | Wt 322.0 lb

## 2020-06-21 DIAGNOSIS — E8881 Metabolic syndrome: Secondary | ICD-10-CM

## 2020-06-21 DIAGNOSIS — Z9189 Other specified personal risk factors, not elsewhere classified: Secondary | ICD-10-CM

## 2020-06-21 DIAGNOSIS — Z6841 Body Mass Index (BMI) 40.0 and over, adult: Secondary | ICD-10-CM

## 2020-06-21 DIAGNOSIS — F418 Other specified anxiety disorders: Secondary | ICD-10-CM

## 2020-06-21 DIAGNOSIS — E88819 Insulin resistance, unspecified: Secondary | ICD-10-CM

## 2020-06-21 DIAGNOSIS — R632 Polyphagia: Secondary | ICD-10-CM | POA: Diagnosis not present

## 2020-06-28 NOTE — Progress Notes (Signed)
Chief Complaint:   OBESITY Andre Perez is here to discuss his progress with his obesity treatment plan along with follow-up of his obesity related diagnoses.   Today's visit was #: 11 Starting weight: 353 lbs Starting date: 10/28/2019 Today's weight: 322 lbs Today's date: 06/21/2020 Total lbs lost to date: 31 lbs Body mass index is 41.34 kg/m.  Total weight loss percentage to date: -8.78%  Interim History: [name] says he is taking Saxenda 3 mg daily for 2 good weeks. He is experiencing increased anxiety. He is having increased cravings. Sleep: Number of hours slept each night: ~6. He wakes in the early morning. Trazodone 25 mg helps but leaves him feeling groggy.    Current Meal Plan: the Category 4 Plan.  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 Current Anti-Obesity Medications: Saxenda 3 mg subcutaneously weekly. Side effects: None.  Assessment/Plan:   1. Polyphagia Current treatment: Saxenda 3 mg subcutaneously daily. Polyphagia refers to excessive feelings of hunger.   Plan: He will continue to focus on protein-rich, low simple carbohydrate foods. We reviewed the importance of hydration, regular exercise for stress reduction, and restorative sleep.  2. Insulin resistance Not at goal. Goal is HgbA1c < 5.7, fasting insulin closer to 5.  Medication: Saxenda 3 mg subcutaneously daily.    Plan:  He will continue to focus on protein-rich, low simple carbohydrate foods. We reviewed the importance of hydration, regular exercise for stress reduction, and restorative sleep.   No results found for: HGBA1C Lab Results  Component Value Date   INSULIN 26.1 (H) 05/17/2020   INSULIN 23.9 10/28/2019   3. Situational anxiety Behavior modification techniques were discussed today to help Andre Perez deal with his anxiety.  We discussed SSRIs in this setting. He declines for now.  4. At risk for impaired metabolic function Due to Cruise's current state  of health and medical condition(s), he is at a significantly higher risk for impaired metabolic function.   At least 8 minutes was spent on counseling Andre Perez about these concerns today.  This places the patient at a much greater risk to subsequently develop cardio-pulmonary conditions that can negatively affect the patient's quality of life.  I stressed the importance of reversing these risks factors.  The initial goal is to lose at least 5-10% of starting weight to help reduce risk factors.  Counseling:  Intensive lifestyle modifications discussed with [name] as the most appropriate first line treatment.  He will continue to work on diet, exercise, and weight loss efforts.  We will continue to reassess these conditions on a fairly regular basis in an attempt to decrease the patient's overall morbidity and mortality.  5. Obesity, current BMI 41.4  Course: Andre Perez is currently in the action stage of change. As such, his goal is to continue with weight loss efforts.   Nutrition goals: He has agreed to the Category 4 Plan.   Exercise goals: For substantial health benefits, adults should do at least 150 minutes (2 hours and 30 minutes) a week of moderate-intensity, or 75 minutes (1 hour and 15 minutes) a week of vigorous-intensity aerobic physical activity, or an equivalent combination of moderate- and vigorous-intensity aerobic activity. Aerobic activity should be performed in episodes of at least 10 minutes, and preferably, it should be spread throughout the week.  Behavioral modification strategies: increasing lean protein intake, decreasing simple carbohydrates, increasing vegetables, increasing water intake and decreasing liquid calories.  Andre Perez has agreed to follow-up with our clinic in  4 weeks. He was informed of the importance of frequent follow-up visits to maximize his success with intensive lifestyle modifications for his multiple health conditions.   Objective:   Blood pressure (!)  141/84, pulse 94, temperature 98.1 F (36.7 C), temperature source Oral, height 6\' 2"  (1.88 m), weight (!) 322 lb (146.1 kg), SpO2 99 %. Body mass index is 41.34 kg/m.  General: Cooperative, alert, well developed, in no acute distress. HEENT: Conjunctivae and lids unremarkable. Cardiovascular: Regular rhythm.  Lungs: Normal work of breathing. Neurologic: No focal deficits.   Lab Results  Component Value Date   CREATININE 0.90 05/17/2020   BUN 13 05/17/2020   NA 142 05/17/2020   K 4.5 05/17/2020   CL 104 05/17/2020   CO2 21 05/17/2020   Lab Results  Component Value Date   ALT 18 05/17/2020   AST 16 05/17/2020   ALKPHOS 88 05/17/2020   BILITOT 0.3 05/17/2020   Lab Results  Component Value Date   INSULIN 26.1 (H) 05/17/2020   INSULIN 23.9 10/28/2019   Lab Results  Component Value Date   TSH 1.300 05/17/2020   Lab Results  Component Value Date   CHOL 153 05/17/2020   HDL 39 (L) 05/17/2020   LDLCALC 94 05/17/2020   TRIG 110 05/17/2020   CHOLHDL 3.9 05/17/2020   Lab Results  Component Value Date   WBC 9.4 05/17/2020   HGB 15.6 05/17/2020   HCT 46.2 05/17/2020   MCV 91 05/17/2020   PLT 277 05/17/2020   Attestation Statements:   Reviewed by clinician on day of visit: allergies, medications, problem list, medical history, surgical history, family history, social history, and previous encounter notes.  05/19/2020 Friedenbach, CMA, am acting as [id number] for Energy manager, DO.  I have reviewed the above documentation for accuracy and completeness, and I agree with the above. [employer], DO

## 2020-07-26 ENCOUNTER — Telehealth (INDEPENDENT_AMBULATORY_CARE_PROVIDER_SITE_OTHER): Payer: BC Managed Care – PPO | Admitting: Family Medicine

## 2020-07-26 ENCOUNTER — Encounter (INDEPENDENT_AMBULATORY_CARE_PROVIDER_SITE_OTHER): Payer: Self-pay | Admitting: Family Medicine

## 2020-07-26 ENCOUNTER — Other Ambulatory Visit: Payer: Self-pay

## 2020-07-26 DIAGNOSIS — R632 Polyphagia: Secondary | ICD-10-CM

## 2020-07-26 DIAGNOSIS — E65 Localized adiposity: Secondary | ICD-10-CM

## 2020-07-26 DIAGNOSIS — E8881 Metabolic syndrome: Secondary | ICD-10-CM | POA: Diagnosis not present

## 2020-07-26 DIAGNOSIS — G4733 Obstructive sleep apnea (adult) (pediatric): Secondary | ICD-10-CM

## 2020-07-26 DIAGNOSIS — Z6841 Body Mass Index (BMI) 40.0 and over, adult: Secondary | ICD-10-CM

## 2020-07-26 MED ORDER — SAXENDA 18 MG/3ML ~~LOC~~ SOPN: 3.0000 mg | PEN_INJECTOR | Freq: Every day | SUBCUTANEOUS | 0 refills | Status: DC

## 2020-07-27 ENCOUNTER — Encounter (INDEPENDENT_AMBULATORY_CARE_PROVIDER_SITE_OTHER): Payer: Self-pay | Admitting: Family Medicine

## 2020-07-27 DIAGNOSIS — Z20822 Contact with and (suspected) exposure to covid-19: Secondary | ICD-10-CM | POA: Diagnosis not present

## 2020-07-28 MED ORDER — NIRMATRELVIR/RITONAVIR (PAXLOVID)TABLET: 3.0000 | ORAL_TABLET | Freq: Two times a day (BID) | ORAL | 0 refills | Status: AC

## 2020-07-28 MED ORDER — NIRMATRELVIR/RITONAVIR (PAXLOVID)TABLET: 3.0000 | ORAL_TABLET | Freq: Two times a day (BID) | ORAL | 0 refills | Status: DC

## 2020-07-28 NOTE — Addendum Note (Signed)
Addended by: Scarlett Presto on: 07/28/2020 12:50 PM   Modules accepted: Orders

## 2020-08-02 ENCOUNTER — Encounter (INDEPENDENT_AMBULATORY_CARE_PROVIDER_SITE_OTHER): Payer: Self-pay | Admitting: Family Medicine

## 2020-08-02 NOTE — Progress Notes (Signed)
TeleHealth Visit:  Due to the COVID-19 pandemic, this visit was completed with telemedicine (audio/video) technology to reduce patient and provider exposure as well as to preserve personal protective equipment.   Andre Perez has verbally consented to this TeleHealth visit. The patient is located at home, the provider is located at the [facility] office. The participants in this visit include the listed provider and patient. The visit was conducted today via MyChart video.  Chief Complaint: OBESITY Andre Perez is here to discuss his progress with his obesity treatment plan along with follow-up of his obesity related diagnoses. Andre Perez is on the Category 4 Plan and states he is following his eating plan approximately 60% of the time. Andre Perez states he is playing sports for 45 minutes 2 times per week.  Today's visit was #: 12 Starting weight: 353 lbs Starting date: 10/28/2019  Interim History: Andre Perez's wife had a positive COVID test today.  Andre Perez has no symptoms at this time.  He has been journaling and is getting around 2600 calories average per day.  Not getting enough protein.  Increased carbohydrates, per patient report.  Increased hunger.  His sleep is improving, he says.  Assessment/Plan:   1. Metabolic syndrome Starting goal: Lose 7-10% of starting weight. He will continue to focus on protein-rich, low simple carbohydrate foods. We reviewed the importance of hydration, regular exercise for stress reduction, and restorative sleep.  We will continue to check lab work every 3 months, with 10% weight loss, or should any other concerns arise.  - Refill Liraglutide -Weight Management (SAXENDA) 18 MG/3ML SOPN; Inject 3 mg into the skin daily.  Dispense: 45 mL; Refill: 0  2. Obstructive sleep apnea syndrome OSA is a cause of systemic hypertension and is associated with an increased incidence of stroke, heart failure, atrial fibrillation, and coronary heart disease. Severe OSA  increases all-cause mortality and  cardiovascular mortality.   Goal: Treatment of OSA via CPAP compliance and weight loss. . Plasma ghrelin levels (appetite or "hunger hormone") are significantly higher in OSA patients than in BMI-matched controls, but decrease to levels similar to those of obese patients without OSA after CPAP treatment.  . Weight loss improves OSA by several mechanisms, including reduction in fatty tissue in the throat (i.e. parapharyngeal fat) and the tongue. Loss of abdominal fat increases mediastinal traction on the upper airway making it less likely to collapse during sleep. . Studies have also shown that compliance with CPAP treatment improves leptin (hunger inhibitory hormone) imbalance.  3. Visceral obesity Visceral fat rating should be < 13. Visceral adipose tissue is a hormonally active component of total body fat. This body composition phenotype is associated with medical disorders such as metabolic syndrome, cardiovascular disease and several malignancies including prostate, breast, and colorectal cancers. Starting goal: Lose 7-10% of starting weight.   4. Polyphagia Not at goal. Current treatment: Saxenda 3 mg subcutaneously daily. Polyphagia refers to excessive feelings of hunger. He will continue to focus on protein-rich, low simple carbohydrate foods. We reviewed the importance of hydration, regular exercise for stress reduction, and restorative sleep.  5. Obesity, current BMI 41.4  [place] is currently in the action stage of change. As such, his goal is to continue with weight loss efforts. He has agreed to the Category 4 Plan.   Exercise goals: For substantial health benefits, adults should do at least 150 minutes (2 hours and 30 minutes) a week of moderate-intensity, or 75 minutes (1 hour and 15 minutes) a week of vigorous-intensity aerobic  physical activity, or an equivalent combination of moderate- and vigorous-intensity aerobic activity. Aerobic activity  should be performed in episodes of at least 10 minutes, and preferably, it should be spread throughout the week.  Behavioral modification strategies: increasing lean protein intake, decreasing simple carbohydrates, increasing vegetables and increasing water intake.  Andre Perez has agreed to follow-up with our clinic in 3 weeks. He was informed of the importance of frequent follow-up visits to maximize his success with intensive lifestyle modifications for his multiple health conditions.  Objective:   VITALS: Per patient if applicable, see vitals. GENERAL: Alert and in no acute distress. CARDIOPULMONARY: No increased WOB. Speaking in clear sentences.  PSYCH: Pleasant and cooperative. Speech normal rate and rhythm. Affect is appropriate. Insight and judgement are appropriate. Attention is focused, linear, and appropriate.  NEURO: Oriented as arrived to appointment on time with no prompting.   Lab Results  Component Value Date   CREATININE 0.90 05/17/2020   BUN 13 05/17/2020   NA 142 05/17/2020   K 4.5 05/17/2020   CL 104 05/17/2020   CO2 21 05/17/2020   Lab Results  Component Value Date   ALT 18 05/17/2020   AST 16 05/17/2020   ALKPHOS 88 05/17/2020   BILITOT 0.3 05/17/2020   Lab Results  Component Value Date   INSULIN 26.1 (H) 05/17/2020   INSULIN 23.9 10/28/2019   Lab Results  Component Value Date   TSH 1.300 05/17/2020   Lab Results  Component Value Date   CHOL 153 05/17/2020   HDL 39 (L) 05/17/2020   LDLCALC 94 05/17/2020   TRIG 110 05/17/2020   CHOLHDL 3.9 05/17/2020   Lab Results  Component Value Date   WBC 9.4 05/17/2020   HGB 15.6 05/17/2020   HCT 46.2 05/17/2020   MCV 91 05/17/2020   PLT 277 05/17/2020   Attestation Statements:   Reviewed by clinician on day of visit: allergies, medications, problem list, medical history, surgical history, family history, social history, and previous encounter notes.  I, Insurance claims handler, CMA, am acting as transcriptionist  for Helane Rima, DO  I have reviewed the above documentation for accuracy and completeness, and I agree with the above. Helane Rima, DO

## 2020-08-02 NOTE — Telephone Encounter (Signed)
Pt last seen by Dr. Wallace.  

## 2020-08-26 ENCOUNTER — Other Ambulatory Visit: Payer: Self-pay

## 2020-08-26 ENCOUNTER — Ambulatory Visit (INDEPENDENT_AMBULATORY_CARE_PROVIDER_SITE_OTHER): Payer: BC Managed Care – PPO | Admitting: Family Medicine

## 2020-08-26 ENCOUNTER — Encounter (INDEPENDENT_AMBULATORY_CARE_PROVIDER_SITE_OTHER): Payer: Self-pay | Admitting: Family Medicine

## 2020-08-26 VITALS — BP 113/78 | HR 79 | Temp 98.1°F | Ht 74.0 in | Wt 321.0 lb

## 2020-08-26 DIAGNOSIS — R632 Polyphagia: Secondary | ICD-10-CM | POA: Diagnosis not present

## 2020-08-26 DIAGNOSIS — Z6841 Body Mass Index (BMI) 40.0 and over, adult: Secondary | ICD-10-CM

## 2020-08-26 DIAGNOSIS — K219 Gastro-esophageal reflux disease without esophagitis: Secondary | ICD-10-CM

## 2020-08-26 DIAGNOSIS — Z9189 Other specified personal risk factors, not elsewhere classified: Secondary | ICD-10-CM

## 2020-08-26 MED ORDER — SAXENDA 18 MG/3ML ~~LOC~~ SOPN: 3.0000 mg | PEN_INJECTOR | Freq: Every day | SUBCUTANEOUS | 3 refills | Status: DC

## 2020-08-26 MED ORDER — SAXENDA 18 MG/3ML ~~LOC~~ SOPN: 3.0000 mg | PEN_INJECTOR | Freq: Every day | SUBCUTANEOUS | 0 refills | Status: DC

## 2020-09-01 NOTE — Progress Notes (Signed)
Chief Complaint:   OBESITY Andre Perez is here to discuss his progress with his obesity treatment plan along with follow-up of his obesity related diagnoses.   Today's visit was #: 13 Starting weight: 353 lbs Starting date: 10/28/2019 Today's weight: 321 lbs Today's date: 08/26/2020 Weight change since last visit: -1 lb Total lbs lost to date: 32 lbs Body mass index is 41.21 kg/m.  Total weight loss percentage to date: -9.07%  Interim History: Andre Perez reports he was off [place] for 1 1/2 weeks due to insurance issues. He eased his was back to full dose. He had Covid but is now feeling much better. He denies exercise intolerance.   Current Meal Plan: the Category 4 Plan for 70% of the time.  Current Exercise Plan: playing sports and doing aerobic exercises for 30 minutes 3 times per week. Current Anti-Obesity Medications: Saxenda 3 mg subcutaneously daily. Side effects: None.  Assessment/Plan:   1. Polyphagia Improving, but not optimized. Current treatment: Saxenda 3 mg subcutaneously daily.   Plan: He will continue to focus on protein-rich, low simple carbohydrate foods. We reviewed the importance of hydration, regular exercise for stress reduction, and restorative sleep. We will refill Saxenda, as per below.  - Refill Liraglutide -Weight Management (SAXENDA) 18 MG/3ML SOPN; Inject 3 mg into the skin daily.  Dispense: 45 mL; Refill: 3  2. Gastroesophageal reflux disease, unspecified whether esophagitis present We reviewed the diagnosis of GERD and importance of treatment. We discussed "red flag" symptoms and the importance of follow up if symptoms persisted despite treatment. We reviewed non-pharmacologic management of GERD symptoms: including: caffeine reduction, dietary changes, elevate HOB, NPO after supper, reduction of alcohol intake, tobacco cessation, and weight loss.  3. At risk for heart disease Due to Andre Perez's current state of health and medical condition(s), he is  at a higher risk for heart disease.   This puts the patient at much greater risk to subsequently develop cardiopulmonary conditions that can significantly affect patient's quality of life in a negative manner as well.    At least 9 minutes was spent on counseling Andre Perez about these concerns today. Initial goal is to lose at least 5-10% of starting weight to help reduce these risk factors.  We will continue to reassess these conditions on a fairly regular basis in an attempt to decrease patient's overall morbidity and mortality.  Evidence-based interventions for health behavior change were utilized today including the discussion of self monitoring techniques, problem-solving barriers and SMART goal setting techniques.  Specifically regarding patient's less desirable eating habits and patterns, we employed the technique of small changes when Andre Perez has not been able to fully commit to his prudent nutritional plan.  4. Obesity, current BMI 41.2 Course: Andre Perez is currently in the action stage of change. As such, his goal is to continue with weight loss efforts.   Nutrition goals: He has agreed to the Category 4 Plan.   Exercise goals: For substantial health benefits, adults should do at least 150 minutes (2 hours and 30 minutes) a week of moderate-intensity, or 75 minutes (1 hour and 15 minutes) a week of vigorous-intensity aerobic physical activity, or an equivalent combination of moderate- and vigorous-intensity aerobic activity. Aerobic activity should be performed in episodes of at least 10 minutes, and preferably, it should be spread throughout the week.  Behavioral modification strategies: increasing lean protein intake, decreasing simple carbohydrates, and increasing vegetables.  Andre Perez has agreed to follow-up with our clinic in 4 weeks. He was informed of the  importance of frequent follow-up visits to maximize his success with intensive lifestyle modifications for his multiple health  conditions.   Objective:   Blood pressure 113/78, pulse 79, temperature 98.1 F (36.7 C), temperature source Oral, height 6\' 2"  (1.88 m), weight (!) 321 lb (145.6 kg), SpO2 95 %. Body mass index is 41.21 kg/m.  General: Cooperative, alert, well developed, in no acute distress. HEENT: Conjunctivae and lids unremarkable. Cardiovascular: Regular rhythm.  Lungs: Normal work of breathing. Neurologic: No focal deficits.   Lab Results  Component Value Date   CREATININE 0.90 05/17/2020   BUN 13 05/17/2020   NA 142 05/17/2020   K 4.5 05/17/2020   CL 104 05/17/2020   CO2 21 05/17/2020   Lab Results  Component Value Date   ALT 18 05/17/2020   AST 16 05/17/2020   ALKPHOS 88 05/17/2020   BILITOT 0.3 05/17/2020   Lab Results  Component Value Date   INSULIN 26.1 (H) 05/17/2020   INSULIN 23.9 10/28/2019   Lab Results  Component Value Date   TSH 1.300 05/17/2020   Lab Results  Component Value Date   CHOL 153 05/17/2020   HDL 39 (L) 05/17/2020   LDLCALC 94 05/17/2020   TRIG 110 05/17/2020   CHOLHDL 3.9 05/17/2020   Lab Results  Component Value Date   WBC 9.4 05/17/2020   HGB 15.6 05/17/2020   HCT 46.2 05/17/2020   MCV 91 05/17/2020   PLT 277 05/17/2020   Attestation Statements:   Reviewed by clinician on day of visit: allergies, medications, problem list, medical history, surgical history, family history, social history, and previous encounter notes.  05/19/2020 Friedenbach, CMA, am acting as 08-24-1972 for Energy manager, DO.  I have reviewed the above documentation for accuracy and completeness, and I agree with the above. [employer], DO

## 2020-09-27 ENCOUNTER — Other Ambulatory Visit: Payer: Self-pay

## 2020-09-27 ENCOUNTER — Ambulatory Visit (INDEPENDENT_AMBULATORY_CARE_PROVIDER_SITE_OTHER): Payer: BC Managed Care – PPO | Admitting: Family Medicine

## 2020-09-27 ENCOUNTER — Encounter (INDEPENDENT_AMBULATORY_CARE_PROVIDER_SITE_OTHER): Payer: Self-pay | Admitting: Family Medicine

## 2020-09-27 VITALS — BP 133/97 | HR 96 | Temp 98.0°F | Ht 74.0 in | Wt 332.0 lb

## 2020-09-27 DIAGNOSIS — Z6841 Body Mass Index (BMI) 40.0 and over, adult: Secondary | ICD-10-CM

## 2020-09-27 DIAGNOSIS — E65 Localized adiposity: Secondary | ICD-10-CM

## 2020-09-27 DIAGNOSIS — Z9189 Other specified personal risk factors, not elsewhere classified: Secondary | ICD-10-CM

## 2020-09-27 DIAGNOSIS — E8881 Metabolic syndrome: Secondary | ICD-10-CM

## 2020-09-27 DIAGNOSIS — E66813 Obesity, class 3: Secondary | ICD-10-CM

## 2020-09-27 DIAGNOSIS — R7301 Impaired fasting glucose: Secondary | ICD-10-CM

## 2020-09-27 DIAGNOSIS — R632 Polyphagia: Secondary | ICD-10-CM | POA: Diagnosis not present

## 2020-09-27 MED ORDER — TIRZEPATIDE 2.5 MG/0.5ML ~~LOC~~ SOAJ: 2.5000 mg | SUBCUTANEOUS | 0 refills | Status: DC

## 2020-10-07 NOTE — Progress Notes (Signed)
Chief Complaint:   OBESITY Andre Perez is here to discuss his progress with his obesity treatment plan along with follow-up of his obesity related diagnoses.   Today's visit was #: 14 Starting weight: 353 lbs Starting date: 10/28/2019 Today's weight: 332 lbs Today's date: 09/27/2020 Weight change since last visit: +11 lbs Total lbs lost to date: 21 lbs Body mass index is 42.63 kg/m.  Total weight loss percentage to date: -5.95%  Interim History:  Andre Perez endorses polyphagia.  He says his schedule has been off since summer started. Current Meal Plan: the Category 4 Plan for 55% of the time.  Current Exercise Plan: Sports/swimming/yard work for 30 minutes 3 times per week. Current Anti-Obesity Medications: Saxenda 3 mg subcutaneously daily. Side effects: None.  Assessment/Plan:   Meds ordered this encounter  Medications   tirzepatide (MOUNJARO) 2.5 MG/0.5ML Pen    Sig: Inject 2.5 mg into the skin once a week.    Dispense:  2 mL    Refill:  0    1. Impaired fasting glucose Stop Saxenda and start Mounjaro 2.5 mg subcutaneously weekly, as per below.  - Start tirzepatide [facility]) 2.5 MG/0.5ML Pen; Inject 2.5 mg into the skin once a week.  Dispense: 2 mL; Refill: 0  2. Polyphagia Uncontrolled.  Current treatment: Saxenda 3 mg subcutaneously daily. Polyphagia refers to excessive feelings of hunger. He will continue to focus on protein-rich, low simple carbohydrate foods. We reviewed the importance of hydration, regular exercise for stress reduction, and restorative sleep.  3. Metabolic syndrome Starting goal: Lose 7-10% of starting weight. He will continue to focus on protein-rich, low simple carbohydrate foods. We reviewed the importance of hydration, regular exercise for stress reduction, and restorative sleep.  We will continue to check lab work every 3 months, with 10% weight loss, or should any other concerns arise.  4. Visceral obesity Current visceral fat rating: 21.  Visceral fat rating should be < 13. Visceral adipose tissue is a hormonally active component of total body fat. This body composition phenotype is associated with medical disorders such as metabolic syndrome, cardiovascular disease and several malignancies including prostate, breast, and colorectal cancers. Starting goal: Lose 7-10% of starting weight.   5. At risk for heart disease Due to Andre Perez's current state of health and medical condition(s), he is at a higher risk for heart disease.  This puts the patient at much greater risk to subsequently develop cardiopulmonary conditions that can significantly affect patient's quality of life in a negative manner.    At least 9 minutes were spent on counseling Andre Perez about these concerns today. Evidence-based interventions for health behavior change were utilized today including the discussion of self monitoring techniques, problem-solving barriers, and SMART goal setting techniques.  Specifically, regarding patient's less desirable eating habits and patterns, we employed the technique of small changes when Andre Perez has not been able to fully commit to his prudent nutritional plan.  6. Obesity, current BMI 42.7  Course: Andre Perez is currently in the action stage of change. As such, his goal is to continue with weight loss efforts.   Nutrition goals: He has agreed to the Category 4 Plan.   Exercise goals:  As is.  Behavioral modification strategies: increasing lean protein intake, decreasing simple carbohydrates, increasing vegetables, and increasing water intake.  Andre Perez has agreed to follow-up with our clinic in 4 weeks. He was informed of the importance of frequent follow-up visits to maximize his success with intensive lifestyle modifications for his multiple health conditions.  Objective:   Blood pressure (!) 133/97, pulse 96, temperature 98 F (36.7 C), temperature source Oral, height 6\' 2"  (1.88 m), weight (!) 332 lb (150.6 kg), SpO2 98  %. Body mass index is 42.63 kg/m.  General: Cooperative, alert, well developed, in no acute distress. HEENT: Conjunctivae and lids unremarkable. Cardiovascular: Regular rhythm.  Lungs: Normal work of breathing. Neurologic: No focal deficits.   Lab Results  Component Value Date   CREATININE 0.90 05/17/2020   BUN 13 05/17/2020   NA 142 05/17/2020   K 4.5 05/17/2020   CL 104 05/17/2020   CO2 21 05/17/2020   Lab Results  Component Value Date   ALT 18 05/17/2020   AST 16 05/17/2020   ALKPHOS 88 05/17/2020   BILITOT 0.3 05/17/2020   Lab Results  Component Value Date   INSULIN 26.1 (H) 05/17/2020   INSULIN 23.9 10/28/2019   Lab Results  Component Value Date   TSH 1.300 05/17/2020   Lab Results  Component Value Date   CHOL 153 05/17/2020   HDL 39 (L) 05/17/2020   LDLCALC 94 05/17/2020   TRIG 110 05/17/2020   CHOLHDL 3.9 05/17/2020   Lab Results  Component Value Date   VD25OH 28.0 (L) 05/17/2020   VD25OH 22.5 (L) 10/28/2019   Lab Results  Component Value Date   WBC 9.4 05/17/2020   HGB 15.6 05/17/2020   HCT 46.2 05/17/2020   MCV 91 05/17/2020   PLT 277 05/17/2020   Attestation Statements:   Reviewed by clinician on day of visit: allergies, medications, problem list, medical history, surgical history, family history, social history, and previous encounter notes.  I, 05/19/2020, CMA, am acting as transcriptionist for Insurance claims handler, DO  I have reviewed the above documentation for accuracy and completeness, and I agree with the above. Helane Rima, DO

## 2020-10-26 ENCOUNTER — Encounter (INDEPENDENT_AMBULATORY_CARE_PROVIDER_SITE_OTHER): Payer: Self-pay | Admitting: Family Medicine

## 2020-10-26 ENCOUNTER — Ambulatory Visit (INDEPENDENT_AMBULATORY_CARE_PROVIDER_SITE_OTHER): Payer: BC Managed Care – PPO | Admitting: Family Medicine

## 2020-10-26 ENCOUNTER — Other Ambulatory Visit: Payer: Self-pay

## 2020-10-26 VITALS — BP 123/86 | HR 82 | Temp 97.9°F | Ht 74.0 in | Wt 334.0 lb

## 2020-10-26 DIAGNOSIS — Z9189 Other specified personal risk factors, not elsewhere classified: Secondary | ICD-10-CM | POA: Diagnosis not present

## 2020-10-26 DIAGNOSIS — G4709 Other insomnia: Secondary | ICD-10-CM

## 2020-10-26 DIAGNOSIS — R632 Polyphagia: Secondary | ICD-10-CM | POA: Diagnosis not present

## 2020-10-26 DIAGNOSIS — Z6841 Body Mass Index (BMI) 40.0 and over, adult: Secondary | ICD-10-CM

## 2020-10-26 DIAGNOSIS — E8881 Metabolic syndrome: Secondary | ICD-10-CM | POA: Diagnosis not present

## 2020-10-26 DIAGNOSIS — R7301 Impaired fasting glucose: Secondary | ICD-10-CM

## 2020-10-26 MED ORDER — TRAZODONE HCL 50 MG PO TABS
ORAL_TABLET | ORAL | 0 refills | Status: DC
Start: 2020-10-26 — End: 2021-01-10

## 2020-10-26 MED ORDER — TIRZEPATIDE 5 MG/0.5ML ~~LOC~~ SOAJ: 5.0000 mg | SUBCUTANEOUS | 0 refills | Status: DC

## 2020-10-27 NOTE — Progress Notes (Signed)
Chief Complaint:   OBESITY Andre Perez is here to discuss his progress with his obesity treatment plan along with follow-up of his obesity related diagnoses.   Today's visit was #: 15 Starting weight: 353 lbs Starting date: 10/28/2019 Today's weight: 334 lbs Today's date: 10/26/2020 Weight change since last visit: +2 lbs Total lbs lost to date: 19 lbs Body mass index is 42.88 kg/m.  Total weight loss percentage to date: -5.38%  Interim History:  Andre Perez says he is still enjoying family time this summer, leading to overeating.  He says he had increased polyphagia with the transition from [place] to [facility] 2.5.  Current Meal Plan: the Category 4 Plan for 55% of the time.  Current Exercise Plan: Swimming/sports/gardening for 30 minutes 3 times per week. Current Anti-Obesity Medications: Mounjaro 2.5 mg subcutaneously weekly. Side effects: None.  Assessment/Plan:   1. Impaired fasting glucose Increase Mounjaro to 5 mg subcutaneously weekly, as per below.  - Increase and refill tirzepatide (MOUNJARO) 5 MG/0.5ML Pen; Inject 5 mg into the skin once a week.  Dispense: 6 mL; Refill: 0  2. Other insomnia Average hours of sleep per night: 6.5-7.5. Current treatment: trazodone 50 mg at bedtime.  Plan: Recommend sleep hygiene measures including regular sleep schedule, optimal sleep environment, and relaxing presleep rituals.   - Refill traZODone (DESYREL) 50 MG tablet; TAKE 1 TABLET BY MOUTH EVERYDAY AT BEDTIME  Dispense: 30 tablet; Refill: 0  3. Polyphagia Not at goal. Current treatment: Mounjaro 2.5 mg subcutaneously weekly. He will continue to focus on protein-rich, low simple carbohydrate foods. We reviewed the importance of hydration, regular exercise for stress reduction, and restorative sleep.  - Increase and refill tirzepatide (MOUNJARO) 5 MG/0.5ML Pen; Inject 5 mg into the skin once a week.  Dispense: 6 mL; Refill: 0  4. Metabolic syndrome Starting goal: Lose 7-10% of  starting weight. He will continue to focus on protein-rich, low simple carbohydrate foods. We reviewed the importance of hydration, regular exercise for stress reduction, and restorative sleep.  We will continue to check lab work every 3 months, with 10% weight loss, or should any other concerns arise.  - Increase and refill tirzepatide (MOUNJARO) 5 MG/0.5ML Pen; Inject 5 mg into the skin once a week.  Dispense: 6 mL; Refill: 0  5. At risk for heart disease Due to Andre Perez's current state of health and medical condition(s), he is at a higher risk for heart disease.  This puts the patient at much greater risk to subsequently develop cardiopulmonary conditions that can significantly affect patient's quality of life in a negative manner.    At least 8 minutes were spent on counseling Andre Perez about these concerns today. Evidence-based interventions for health behavior change were utilized today including the discussion of self monitoring techniques, problem-solving barriers, and SMART goal setting techniques.  Specifically, regarding patient's less desirable eating habits and patterns, we employed the technique of small changes when Andre Perez has not been able to fully commit to his prudent nutritional plan.  6. Obesity, current BMI 43  Course: Andre Perez is currently in the action stage of change. As such, his goal is to continue with weight loss efforts.   Nutrition goals: He has agreed to the Category 4 Plan.   Exercise goals:  As is.  Behavioral modification strategies: increasing lean protein intake, decreasing simple carbohydrates, increasing vegetables, and increasing water intake.  Andre Perez has agreed to follow-up with our clinic in 4 weeks. He was informed of the importance of frequent follow-up visits  to maximize his success with intensive lifestyle modifications for his multiple health conditions.   Objective:   Blood pressure 123/86, pulse 82, temperature 97.9 F (36.6 C), temperature source  Oral, height 6\' 2"  (1.88 m), weight (!) 334 lb (151.5 kg), SpO2 97 %. Body mass index is 42.88 kg/m.  General: Cooperative, alert, well developed, in no acute distress. HEENT: Conjunctivae and lids unremarkable. Cardiovascular: Regular rhythm.  Lungs: Normal work of breathing. Neurologic: No focal deficits.   Lab Results  Component Value Date   CREATININE 0.90 05/17/2020   BUN 13 05/17/2020   NA 142 05/17/2020   K 4.5 05/17/2020   CL 104 05/17/2020   CO2 21 05/17/2020   Lab Results  Component Value Date   ALT 18 05/17/2020   AST 16 05/17/2020   ALKPHOS 88 05/17/2020   BILITOT 0.3 05/17/2020   Lab Results  Component Value Date   INSULIN 26.1 (H) 05/17/2020   INSULIN 23.9 10/28/2019   Lab Results  Component Value Date   TSH 1.300 05/17/2020   Lab Results  Component Value Date   CHOL 153 05/17/2020   HDL 39 (L) 05/17/2020   LDLCALC 94 05/17/2020   TRIG 110 05/17/2020   CHOLHDL 3.9 05/17/2020   Lab Results  Component Value Date   VD25OH 28.0 (L) 05/17/2020   VD25OH 22.5 (L) 10/28/2019   Lab Results  Component Value Date   WBC 9.4 05/17/2020   HGB 15.6 05/17/2020   HCT 46.2 05/17/2020   MCV 91 05/17/2020   PLT 277 05/17/2020   Attestation Statements:   Reviewed by clinician on day of visit: allergies, medications, problem list, medical history, surgical history, family history, social history, and previous encounter notes.  I, 05/19/2020, CMA, am acting as transcriptionist for Insurance claims handler, DO  I have reviewed the above documentation for accuracy and completeness, and I agree with the above. Helane Rima, DO

## 2020-11-01 ENCOUNTER — Encounter (INDEPENDENT_AMBULATORY_CARE_PROVIDER_SITE_OTHER): Payer: Self-pay

## 2020-11-01 DIAGNOSIS — M25511 Pain in right shoulder: Secondary | ICD-10-CM | POA: Diagnosis not present

## 2020-11-01 DIAGNOSIS — E291 Testicular hypofunction: Secondary | ICD-10-CM | POA: Diagnosis not present

## 2020-11-01 DIAGNOSIS — Z0000 Encounter for general adult medical examination without abnormal findings: Secondary | ICD-10-CM | POA: Diagnosis not present

## 2020-11-01 DIAGNOSIS — E786 Lipoprotein deficiency: Secondary | ICD-10-CM | POA: Diagnosis not present

## 2020-11-02 NOTE — Telephone Encounter (Signed)
Pt last seen by Dr. Wallace.  

## 2020-11-25 ENCOUNTER — Ambulatory Visit (INDEPENDENT_AMBULATORY_CARE_PROVIDER_SITE_OTHER): Payer: BC Managed Care – PPO | Admitting: Family Medicine

## 2020-11-25 ENCOUNTER — Other Ambulatory Visit: Payer: Self-pay

## 2020-11-25 ENCOUNTER — Encounter (INDEPENDENT_AMBULATORY_CARE_PROVIDER_SITE_OTHER): Payer: Self-pay | Admitting: Family Medicine

## 2020-11-25 VITALS — BP 126/85 | HR 92 | Temp 97.9°F | Ht 74.0 in | Wt 334.0 lb

## 2020-11-25 DIAGNOSIS — R7989 Other specified abnormal findings of blood chemistry: Secondary | ICD-10-CM

## 2020-11-25 DIAGNOSIS — Z9189 Other specified personal risk factors, not elsewhere classified: Secondary | ICD-10-CM

## 2020-11-25 DIAGNOSIS — R7301 Impaired fasting glucose: Secondary | ICD-10-CM

## 2020-11-25 DIAGNOSIS — Z6841 Body Mass Index (BMI) 40.0 and over, adult: Secondary | ICD-10-CM | POA: Diagnosis not present

## 2020-11-25 DIAGNOSIS — E66813 Obesity, class 3: Secondary | ICD-10-CM

## 2020-11-29 ENCOUNTER — Encounter (INDEPENDENT_AMBULATORY_CARE_PROVIDER_SITE_OTHER): Payer: Self-pay | Admitting: Family Medicine

## 2020-11-29 NOTE — Progress Notes (Signed)
Chief Complaint:   OBESITY Andre Perez is here to discuss his progress with his obesity treatment plan along with follow-up of his obesity related diagnoses.   Today's visit was #: 16 Starting weight: 353 lbs Starting date: 10/28/2019 Today's weight: 334 lbs Today's date: 11/25/2020 Weight change since last visit: 0 Total lbs lost to date: 19 lbs Body mass index is 42.88 kg/m.  Total weight loss percentage to date: -5.38%  Current Meal Plan: the Category 4 Plan for 60% of the time.  Current Exercise Plan: Sports/gardening for 30 minutes 3 times per week. Current Anti-Obesity Medications: Mounjaro 5 mg subcutaneously weekly. Side effects: None.  Interim History:  Andre Perez is on Mounjaro 5 mg, and had his 4th dose yesterday.  He is routinely getting in 2200-2600 calories per day.  He says he is working on getting back on plan.  He has been having some right shoulder pain and is seeing Sports Medicine.  He says that Clomid is helping with energy and he is sleeping better.  Assessment/Plan:   1. Impaired fasting glucose, with polyphagia Controlled. Current treatment: Mounjaro 5 mg subcutaneously weekly. He will continue to focus on protein-rich, low simple carbohydrate foods. We reviewed the importance of hydration, regular exercise for stress reduction, and restorative sleep.  Plan:  Continue Mounjaro at current dose.  2. Low testosterone Andre Perez is taking Clomid 25 mg daily.  He says this is also helping his energy level and he is sleeping better. We will continue to monitor symptoms as they relate to his weight loss journey.  3. At risk for heart disease Due to Trevel's current state of health and medical condition(s), he is at a higher risk for heart disease.  This puts the patient at much greater risk to subsequently develop cardiopulmonary conditions that can significantly affect patient's quality of life in a negative manner.    At least 8 minutes were spent on counseling  Andre Perez about these concerns today. Evidence-based interventions for health behavior change were utilized today including the discussion of self monitoring techniques, problem-solving barriers, and SMART goal setting techniques.  Specifically, regarding patient's less desirable eating habits and patterns, we employed the technique of small changes when Andre Perez has not been able to fully commit to his prudent nutritional plan.  4. Class 3 severe obesity with serious comorbidity and body mass index (BMI) of 40.0 to 44.9 in adult, unspecified obesity type (HCC)  Course: Andre Perez is currently in the action stage of change. As such, his goal is to continue with weight loss efforts.   Nutrition goals: He has agreed to the Category 4 Plan.   Exercise goals:  As is.  Behavioral modification strategies: increasing lean protein intake, decreasing simple carbohydrates, increasing vegetables, and increasing water intake.  Andre Perez has agreed to follow-up with our clinic in 4 weeks. He was informed of the importance of frequent follow-up visits to maximize his success with intensive lifestyle modifications for his multiple health conditions.   Objective:   Blood pressure 126/85, pulse 92, temperature 97.9 F (36.6 C), temperature source Oral, height 6\' 2"  (1.88 m), weight (!) 334 lb (151.5 kg), SpO2 97 %. Body mass index is 42.88 kg/m.  General: Cooperative, alert, well developed, in no acute distress. HEENT: Conjunctivae and lids unremarkable. Cardiovascular: Regular rhythm.  Lungs: Normal work of breathing. Neurologic: No focal deficits.   Lab Results  Component Value Date   CREATININE 0.90 05/17/2020   BUN 13 05/17/2020   NA 142 05/17/2020   K  4.5 05/17/2020   CL 104 05/17/2020   CO2 21 05/17/2020   Lab Results  Component Value Date   ALT 18 05/17/2020   AST 16 05/17/2020   ALKPHOS 88 05/17/2020   BILITOT 0.3 05/17/2020   Lab Results  Component Value Date   INSULIN 26.1 (H)  05/17/2020   INSULIN 23.9 10/28/2019   Lab Results  Component Value Date   TSH 1.300 05/17/2020   Lab Results  Component Value Date   CHOL 153 05/17/2020   HDL 39 (L) 05/17/2020   LDLCALC 94 05/17/2020   TRIG 110 05/17/2020   CHOLHDL 3.9 05/17/2020   Lab Results  Component Value Date   VD25OH 28.0 (L) 05/17/2020   VD25OH 22.5 (L) 10/28/2019   Lab Results  Component Value Date   WBC 9.4 05/17/2020   HGB 15.6 05/17/2020   HCT 46.2 05/17/2020   MCV 91 05/17/2020   PLT 277 05/17/2020   Attestation Statements:   Reviewed by clinician on day of visit: allergies, medications, problem list, medical history, surgical history, family history, social history, and previous encounter notes.  I, Insurance claims handler, CMA, am acting as transcriptionist for Helane Rima, DO  I have reviewed the above documentation for accuracy and completeness, and I agree with the above. Helane Rima, DO

## 2020-11-30 MED ORDER — TIRZEPATIDE 7.5 MG/0.5ML ~~LOC~~ SOAJ: 7.5000 mg | SUBCUTANEOUS | 0 refills | Status: DC

## 2020-12-10 ENCOUNTER — Encounter (INDEPENDENT_AMBULATORY_CARE_PROVIDER_SITE_OTHER): Payer: Self-pay | Admitting: Family Medicine

## 2020-12-10 DIAGNOSIS — H66001 Acute suppurative otitis media without spontaneous rupture of ear drum, right ear: Secondary | ICD-10-CM | POA: Diagnosis not present

## 2020-12-15 DIAGNOSIS — E291 Testicular hypofunction: Secondary | ICD-10-CM | POA: Diagnosis not present

## 2020-12-27 MED ORDER — TIRZEPATIDE 7.5 MG/0.5ML ~~LOC~~ SOAJ: 7.5000 mg | SUBCUTANEOUS | 0 refills | Status: DC

## 2020-12-27 NOTE — Telephone Encounter (Signed)
Completed.

## 2020-12-27 NOTE — Telephone Encounter (Signed)
Please review and advise.

## 2021-01-04 ENCOUNTER — Encounter (INDEPENDENT_AMBULATORY_CARE_PROVIDER_SITE_OTHER): Payer: Self-pay | Admitting: Family Medicine

## 2021-01-04 ENCOUNTER — Ambulatory Visit (INDEPENDENT_AMBULATORY_CARE_PROVIDER_SITE_OTHER): Payer: BC Managed Care – PPO | Admitting: Family Medicine

## 2021-01-04 ENCOUNTER — Other Ambulatory Visit: Payer: Self-pay

## 2021-01-04 VITALS — BP 119/84 | HR 98 | Temp 97.8°F | Ht 74.0 in | Wt 323.0 lb

## 2021-01-04 DIAGNOSIS — E65 Localized adiposity: Secondary | ICD-10-CM | POA: Diagnosis not present

## 2021-01-04 DIAGNOSIS — R7301 Impaired fasting glucose: Secondary | ICD-10-CM

## 2021-01-04 DIAGNOSIS — E66813 Obesity, class 3: Secondary | ICD-10-CM

## 2021-01-04 DIAGNOSIS — Z6841 Body Mass Index (BMI) 40.0 and over, adult: Secondary | ICD-10-CM | POA: Diagnosis not present

## 2021-01-04 MED ORDER — TIRZEPATIDE 7.5 MG/0.5ML ~~LOC~~ SOAJ: 7.5000 mg | SUBCUTANEOUS | 1 refills | Status: DC

## 2021-01-06 NOTE — Progress Notes (Signed)
Chief Complaint:   OBESITY Andre Perez is here to discuss his progress with his obesity treatment plan along with follow-up of his obesity related diagnoses. See Medical Weight Management Flowsheet for complete bioelectrical impedance results.  Today's visit was #: 17 Starting weight: 353 lbs Starting date: 10/28/2019 Weight change since last visit: 11 lbs Total lbs lost to date: 30 lbs Total weight loss percentage to date: -8.50%  Nutrition Plan: Category 4 Meal Plan for 65% of the time. Activity: Sports for 45 minutes 2 times per week. Anti-obesity medications: Mounjaro 7.5 mg subcutaneously weekly. Reported side effects: None.  Interim History: Andre Perez says he is doing well.  He is tolerating his medications.  Denies polyphagia.  Assessment/Plan:   1. Impaired fasting glucose, with polyphagia Controlled. Current treatment: Mounjaro 7.5 mg subcutaneously weekly.    Plan: Continue Mounjaro 7.5 mg subcutaneously weekly.  Refill provided today.  He will continue to focus on protein-rich, low simple carbohydrate foods. We reviewed the importance of hydration, regular exercise for stress reduction, and restorative sleep.  - Refill tirzepatide (MOUNJARO) 7.5 MG/0.5ML Pen; Inject 7.5 mg into the skin once a week.  Dispense: 6 mL; Refill: 1  2. Visceral obesity Current visceral fat rating: 19. Visceral fat rating should be < 13. Visceral adipose tissue is a hormonally active component of total body fat. This body composition phenotype is associated with medical disorders such as metabolic syndrome, cardiovascular disease and several malignancies including prostate, breast, and colorectal cancers. Starting goal: Lose 7-10% of starting weight.   3. Obesity, current BMI 41.5  Course: Andre Perez is currently in the action stage of change. As such, his goal is to continue with weight loss efforts.   Nutrition goals: He has agreed to the Category 4 Plan.   Exercise goals:  As  is.  Behavioral modification strategies: increasing lean protein intake, decreasing simple carbohydrates, increasing vegetables, increasing water intake, and decreasing liquid calories.  Andre Perez has agreed to follow-up with our clinic in 4 weeks. He was informed of the importance of frequent follow-up visits to maximize his success with intensive lifestyle modifications for his multiple health conditions.   Objective:   Blood pressure 119/84, pulse 98, temperature 97.8 F (36.6 C), temperature source Oral, height 6\' 2"  (1.88 m), weight (!) 323 lb (146.5 kg), SpO2 98 %. Body mass index is 41.47 kg/m.  General: Cooperative, alert, well developed, in no acute distress. HEENT: Conjunctivae and lids unremarkable. Cardiovascular: Regular rhythm.  Lungs: Normal work of breathing. Neurologic: No focal deficits.   Lab Results  Component Value Date   CREATININE 0.90 05/17/2020   BUN 13 05/17/2020   NA 142 05/17/2020   K 4.5 05/17/2020   CL 104 05/17/2020   CO2 21 05/17/2020   Lab Results  Component Value Date   ALT 18 05/17/2020   AST 16 05/17/2020   ALKPHOS 88 05/17/2020   BILITOT 0.3 05/17/2020   Lab Results  Component Value Date   INSULIN 26.1 (H) 05/17/2020   INSULIN 23.9 10/28/2019   Lab Results  Component Value Date   TSH 1.300 05/17/2020   Lab Results  Component Value Date   CHOL 153 05/17/2020   HDL 39 (L) 05/17/2020   LDLCALC 94 05/17/2020   TRIG 110 05/17/2020   CHOLHDL 3.9 05/17/2020   Lab Results  Component Value Date   VD25OH 28.0 (L) 05/17/2020   VD25OH 22.5 (L) 10/28/2019   Lab Results  Component Value Date   WBC 9.4 05/17/2020   HGB  15.6 05/17/2020   HCT 46.2 05/17/2020   MCV 91 05/17/2020   PLT 277 05/17/2020   Attestation Statements:   Reviewed by clinician on day of visit: allergies, medications, problem list, medical history, surgical history, family history, social history, and previous encounter notes.  I, Insurance claims handler, CMA, am acting as  transcriptionist for Helane Rima, DO  I have reviewed the above documentation for accuracy and completeness, and I agree with the above. -  Helane Rima, DO, MS, FAAFP, DABOM - Family and Bariatric Medicine.

## 2021-01-10 ENCOUNTER — Other Ambulatory Visit (INDEPENDENT_AMBULATORY_CARE_PROVIDER_SITE_OTHER): Payer: Self-pay | Admitting: Family Medicine

## 2021-01-10 DIAGNOSIS — G4709 Other insomnia: Secondary | ICD-10-CM

## 2021-01-10 NOTE — Telephone Encounter (Signed)
Last OV with Dr Wallace 

## 2021-01-24 ENCOUNTER — Encounter (INDEPENDENT_AMBULATORY_CARE_PROVIDER_SITE_OTHER): Payer: Self-pay | Admitting: Family Medicine

## 2021-01-24 DIAGNOSIS — M25511 Pain in right shoulder: Secondary | ICD-10-CM

## 2021-01-25 NOTE — Telephone Encounter (Signed)
Last OV with Dr Wallace 

## 2021-01-27 NOTE — Progress Notes (Signed)
Subjective:    CC: R shoulder pain  I, Molly Weber, LAT, ATC, am serving as scribe for Dr. [name].  HPI: Pt is a 35 y/o male presenting w/ R shoulder pain x at least 2 years. Pt notes the pain has worsened over the last 3 months.  He locates his pain to the superior, anterior, and lateral aspect of the R shoulder.  R shoulder mechanical symptoms: no Radiating pain: no Neck pain: no Aggravating factors: shoulder flex and ABD Treatments tried: Voltaren gel, naproxen, Bengay  Pertinent review of Systems: No fevers or chills  Relevant historical information: Works as an Pensions consultant.  Sleep apnea.  GERD.  Insulin resistance.   Objective:    Vitals:   01/31/21 0829  BP: 118/82  Pulse: 86  SpO2: 97%   General: Well Developed, morbidly obese, and in no acute distress.   MSK: C-spine: Normal-appearing normal motion. Right shoulder normal-appearing Mildly tender palpation anterior shoulder at bicipital groove and AC joint. Range of motion abduction 130 degrees.  Internal rotation lumbar spine.  External rotation full. Strength abduction 4/5.  External rotation 5/5 internal rotation 5/5. Positive Hawkins and Neer's test.  Positive empty can test. Mildly positive Yergason's and speeds test. Pulses capillary refill and sensation are intact distally.  Lab and Radiology Results  Diagnostic Limited MSK Ultrasound of: Right shoulder.  Accuracy limited by body habitus Biceps tendon normal-appearing Subscapularis tendon: Normal-appearing Supraspinatus tendon intact.  Mild to moderate subacromial bursitis. Infraspinatus tendon normal-appearing AC joint normal-appearing Impression: Subacromial bursitis  X-ray images right shoulder obtained today personally and independently interpreted Mild AC DJD.  No acute fractures. Await formal radiology review   Impression and Recommendations:    Assessment and Plan: 35 y.o. male with right shoulder pain ongoing for least 3  months possibly over a year occurring intermittently.  Pain thought to be due to subacromial bursitis and impingement.  Plan for trial of physical therapy.  Discussed the potential for injection.  We will reserve this for if not better.  Recheck back in 6-8 weeks.  If not improved consider injection or potentially MRI.  PDMP not reviewed this encounter. Orders Placed This Encounter  Procedures   [place] LIMITED JOINT SPACE STRUCTURES UP RIGHT(NO LINKED CHARGES)    Standing Status:   Future    Number of Occurrences:   1    Standing Expiration Date:   07/31/2021    Order Specific Question:   Reason for Exam (SYMPTOM  OR DIAGNOSIS REQUIRED)    Answer:   right shoulder pain    Order Specific Question:   Preferred imaging location?    Answer:   [facility] Shoulder Right    Standing Status:   Future    Number of Occurrences:   1    Standing Expiration Date:   01/31/2022    Order Specific Question:   Reason for Exam (SYMPTOM  OR DIAGNOSIS REQUIRED)    Answer:   right shoulder pain    Order Specific Question:   Preferred imaging location?    Answer:   [facility]   Ambulatory referral to Physical Therapy    Referral Priority:   Routine    Referral Type:   Physical Medicine    Referral Reason:   Specialty Services Required    Requested Specialty:   Physical Therapy    Number of Visits Requested:   1   No orders of the defined types were placed in this encounter.  Discussed warning signs or symptoms. Please see discharge instructions. Patient expresses understanding.   The above documentation has been reviewed and is accurate and complete Lynne Leader, M.D.

## 2021-01-31 ENCOUNTER — Ambulatory Visit: Payer: Self-pay

## 2021-01-31 ENCOUNTER — Ambulatory Visit (INDEPENDENT_AMBULATORY_CARE_PROVIDER_SITE_OTHER): Payer: BC Managed Care – PPO

## 2021-01-31 ENCOUNTER — Ambulatory Visit (INDEPENDENT_AMBULATORY_CARE_PROVIDER_SITE_OTHER): Payer: BC Managed Care – PPO | Admitting: Family Medicine

## 2021-01-31 ENCOUNTER — Other Ambulatory Visit: Payer: Self-pay

## 2021-01-31 VITALS — BP 118/82 | HR 86 | Ht 74.0 in | Wt 329.0 lb

## 2021-01-31 DIAGNOSIS — M25511 Pain in right shoulder: Secondary | ICD-10-CM

## 2021-01-31 DIAGNOSIS — G8929 Other chronic pain: Secondary | ICD-10-CM

## 2021-01-31 DIAGNOSIS — M7551 Bursitis of right shoulder: Secondary | ICD-10-CM

## 2021-01-31 NOTE — Patient Instructions (Addendum)
Thank you for coming in today.   Please get an Xray today before you leave   I've referred you to Physical Therapy.  Let us know if you don't hear from them in one week.   Recheck back in 6-8 weeks

## 2021-02-01 NOTE — Progress Notes (Signed)
There are some tiny loose bodies underneath the small joint at the top of the shoulder.  The [facility] joint.  I do not think this is causing her pain but if not better with physical therapy an MRI will show this much better.

## 2021-02-03 ENCOUNTER — Ambulatory Visit (INDEPENDENT_AMBULATORY_CARE_PROVIDER_SITE_OTHER): Payer: BC Managed Care – PPO | Admitting: Family Medicine

## 2021-02-03 ENCOUNTER — Other Ambulatory Visit (HOSPITAL_COMMUNITY): Payer: Self-pay

## 2021-02-03 ENCOUNTER — Encounter (INDEPENDENT_AMBULATORY_CARE_PROVIDER_SITE_OTHER): Payer: Self-pay | Admitting: Family Medicine

## 2021-02-03 ENCOUNTER — Other Ambulatory Visit: Payer: Self-pay

## 2021-02-03 VITALS — BP 117/84 | HR 89 | Temp 98.0°F | Ht 74.0 in | Wt 316.0 lb

## 2021-02-03 DIAGNOSIS — Z6841 Body Mass Index (BMI) 40.0 and over, adult: Secondary | ICD-10-CM

## 2021-02-03 DIAGNOSIS — G4709 Other insomnia: Secondary | ICD-10-CM

## 2021-02-03 DIAGNOSIS — E65 Localized adiposity: Secondary | ICD-10-CM

## 2021-02-03 DIAGNOSIS — R7301 Impaired fasting glucose: Secondary | ICD-10-CM | POA: Diagnosis not present

## 2021-02-03 MED ORDER — TIRZEPATIDE 10 MG/0.5ML ~~LOC~~ SOAJ
10.0000 mg | SUBCUTANEOUS | 0 refills | Status: DC
Start: 2021-02-03 — End: 2021-02-03

## 2021-02-03 MED ORDER — TRAZODONE HCL 50 MG PO TABS: ORAL_TABLET | ORAL | 0 refills | Status: DC

## 2021-02-03 MED ORDER — TIRZEPATIDE 10 MG/0.5ML ~~LOC~~ SOAJ: 10.0000 mg | SUBCUTANEOUS | 0 refills | Status: DC

## 2021-02-03 MED ORDER — TIRZEPATIDE 10 MG/0.5ML ~~LOC~~ SOAJ
10.0000 mg | SUBCUTANEOUS | 0 refills | Status: DC
Start: 2021-02-03 — End: 2021-03-08
  Filled 2021-02-03: qty 2, 28d supply, fill #0

## 2021-02-04 ENCOUNTER — Other Ambulatory Visit (HOSPITAL_COMMUNITY): Payer: Self-pay

## 2021-02-07 DIAGNOSIS — M7541 Impingement syndrome of right shoulder: Secondary | ICD-10-CM | POA: Diagnosis not present

## 2021-02-07 DIAGNOSIS — M25511 Pain in right shoulder: Secondary | ICD-10-CM | POA: Diagnosis not present

## 2021-02-07 NOTE — Progress Notes (Signed)
Chief Complaint:   OBESITY Andre Perez is here to discuss his progress with his obesity treatment plan along with follow-up of his obesity related diagnoses. See Medical Weight Management Flowsheet for complete bioelectrical impedance results.  Today's visit was #: 18 Starting weight: 353 lbs Starting date: 10/28/2019 Weight change since last visit: 7 lbs Total lbs lost to date: 37 lbs Total weight loss percentage to date: -10.48%  Nutrition Plan: Category 4 Plan for 75% of the time. Activity: Playing sports for 45 minutes 2 times per week. Anti-obesity medications: Mounjaro 7.5 mg subcutaneously weekly. Reported side effects: None.  Interim History: Rilan says he is doing well.  Tolerating medications.  He reports getting about 8 hours of sleep per night with 1/2 trazodone each night.  Assessment/Plan:   1. Other insomnia This is well controlled.  Average hours of sleep per night: 8. Current treatment: trazodone 50 mg at bedtime.  Plan: Recommend sleep hygiene measures including regular sleep schedule, optimal sleep environment, and relaxing presleep rituals.   - Refill traZODone (DESYREL) 50 MG tablet; TAKE ONE TABLET BY MOUTH AT BEDTIME  Dispense: 30 tablet; Refill: 0  2. Impaired fasting glucose, with polyphagia Andre Perez is currently taking Mounjaro 7.5 mg subcutaneously weekly.  Plan:  Increase Mounjaro to 10 mg subcutaneously weekly.  - Plan tirzepatide [facility]) 10 MG/0.5ML Pen; Inject 10 mg into the skin once a week.  Dispense: 2 mL; Refill: 0  3. Visceral obesity Current visceral fat rating: 19. Visceral fat rating goal is < 13. Visceral adipose tissue is a hormonally active component of total body fat. This body composition phenotype is associated with medical disorders such as metabolic syndrome, cardiovascular disease, and several malignancies including prostate, breast, and colorectal cancers. Starting goal: Lose 7-10% of starting weight.   4. Obesity, current  BMI 40.7  Course: Andre Perez is currently in the action stage of change. As such, his goal is to continue with weight loss efforts.   Nutrition goals: He has agreed to the Category 4 Plan.   Exercise goals:  As is.  Behavioral modification strategies: increasing lean protein intake, decreasing simple carbohydrates, increasing vegetables, and increasing water intake.  Andre Perez has agreed to follow-up with our clinic in 4 weeks. He was informed of the importance of frequent follow-up visits to maximize his success with intensive lifestyle modifications for his multiple health conditions.   Objective:   Blood pressure 117/84, pulse 89, temperature 98 F (36.7 C), temperature source Oral, height 6\' 2"  (1.88 m), weight (!) 316 lb (143.3 kg), SpO2 99 %. Body mass index is 40.57 kg/m.  General: Cooperative, alert, well developed, in no acute distress. HEENT: Conjunctivae and lids unremarkable. Cardiovascular: Regular rhythm.  Lungs: Normal work of breathing. Neurologic: No focal deficits.   Lab Results  Component Value Date   CREATININE 0.90 05/17/2020   BUN 13 05/17/2020   NA 142 05/17/2020   K 4.5 05/17/2020   CL 104 05/17/2020   CO2 21 05/17/2020   Lab Results  Component Value Date   ALT 18 05/17/2020   AST 16 05/17/2020   ALKPHOS 88 05/17/2020   BILITOT 0.3 05/17/2020   Lab Results  Component Value Date   INSULIN 26.1 (H) 05/17/2020   INSULIN 23.9 10/28/2019   Lab Results  Component Value Date   TSH 1.300 05/17/2020   Lab Results  Component Value Date   CHOL 153 05/17/2020   HDL 39 (L) 05/17/2020   LDLCALC 94 05/17/2020   TRIG 110 05/17/2020  CHOLHDL 3.9 05/17/2020   Lab Results  Component Value Date   VD25OH 28.0 (L) 05/17/2020   VD25OH 22.5 (L) 10/28/2019   Lab Results  Component Value Date   WBC 9.4 05/17/2020   HGB 15.6 05/17/2020   HCT 46.2 05/17/2020   MCV 91 05/17/2020   PLT 277 05/17/2020   Attestation Statements:   Reviewed by clinician on  day of visit: allergies, medications, problem list, medical history, surgical history, family history, social history, and previous encounter notes.  I, Insurance claims handler, CMA, am acting as transcriptionist for Helane Rima, DO  I have reviewed the above documentation for accuracy and completeness, and I agree with the above. -  Helane Rima, DO, MS, FAAFP, DABOM - Family and Bariatric Medicine.

## 2021-02-09 DIAGNOSIS — M7541 Impingement syndrome of right shoulder: Secondary | ICD-10-CM | POA: Diagnosis not present

## 2021-02-09 DIAGNOSIS — M25511 Pain in right shoulder: Secondary | ICD-10-CM | POA: Diagnosis not present

## 2021-02-14 DIAGNOSIS — M25511 Pain in right shoulder: Secondary | ICD-10-CM | POA: Diagnosis not present

## 2021-02-14 DIAGNOSIS — M7541 Impingement syndrome of right shoulder: Secondary | ICD-10-CM | POA: Diagnosis not present

## 2021-02-17 DIAGNOSIS — M25511 Pain in right shoulder: Secondary | ICD-10-CM | POA: Diagnosis not present

## 2021-02-17 DIAGNOSIS — M7541 Impingement syndrome of right shoulder: Secondary | ICD-10-CM | POA: Diagnosis not present

## 2021-02-22 DIAGNOSIS — M7541 Impingement syndrome of right shoulder: Secondary | ICD-10-CM | POA: Diagnosis not present

## 2021-02-22 DIAGNOSIS — M25511 Pain in right shoulder: Secondary | ICD-10-CM | POA: Diagnosis not present

## 2021-03-03 DIAGNOSIS — M25511 Pain in right shoulder: Secondary | ICD-10-CM | POA: Diagnosis not present

## 2021-03-03 DIAGNOSIS — M7541 Impingement syndrome of right shoulder: Secondary | ICD-10-CM | POA: Diagnosis not present

## 2021-03-06 ENCOUNTER — Emergency Department (HOSPITAL_BASED_OUTPATIENT_CLINIC_OR_DEPARTMENT_OTHER): Payer: BC Managed Care – PPO

## 2021-03-06 ENCOUNTER — Emergency Department (HOSPITAL_BASED_OUTPATIENT_CLINIC_OR_DEPARTMENT_OTHER)
Admission: EM | Admit: 2021-03-06 | Discharge: 2021-03-06 | Disposition: A | Discharge status: Home / Self Care | Payer: BC Managed Care – PPO | Attending: Emergency Medicine | Admitting: Emergency Medicine

## 2021-03-06 ENCOUNTER — Encounter (HOSPITAL_BASED_OUTPATIENT_CLINIC_OR_DEPARTMENT_OTHER): Payer: Self-pay

## 2021-03-06 ENCOUNTER — Other Ambulatory Visit: Payer: Self-pay

## 2021-03-06 DIAGNOSIS — J0100 Acute maxillary sinusitis, unspecified: Secondary | ICD-10-CM | POA: Insufficient documentation

## 2021-03-06 DIAGNOSIS — R0602 Shortness of breath: Secondary | ICD-10-CM | POA: Diagnosis not present

## 2021-03-06 DIAGNOSIS — R7989 Other specified abnormal findings of blood chemistry: Secondary | ICD-10-CM | POA: Diagnosis not present

## 2021-03-06 DIAGNOSIS — R059 Cough, unspecified: Secondary | ICD-10-CM | POA: Diagnosis not present

## 2021-03-06 DIAGNOSIS — U071 COVID-19: Secondary | ICD-10-CM

## 2021-03-06 DIAGNOSIS — E041 Nontoxic single thyroid nodule: Secondary | ICD-10-CM | POA: Diagnosis not present

## 2021-03-06 DIAGNOSIS — J329 Chronic sinusitis, unspecified: Secondary | ICD-10-CM | POA: Diagnosis not present

## 2021-03-06 DIAGNOSIS — R911 Solitary pulmonary nodule: Secondary | ICD-10-CM | POA: Insufficient documentation

## 2021-03-06 LAB — CBC WITH DIFFERENTIAL/PLATELET
Abs Immature Granulocytes: 0.03 10*3/uL (ref 0.00–0.07)
Basophils Absolute: 0.1 10*3/uL (ref 0.0–0.1)
Basophils Relative: 1 %
Eosinophils Absolute: 0.2 10*3/uL (ref 0.0–0.5)
Eosinophils Relative: 2 %
HCT: 44.2 % (ref 39.0–52.0)
Hemoglobin: 15.2 g/dL (ref 13.0–17.0)
Immature Granulocytes: 0 %
Lymphocytes Relative: 25 %
Lymphs Abs: 2.6 10*3/uL (ref 0.7–4.0)
MCH: 30.5 pg (ref 26.0–34.0)
MCHC: 34.4 g/dL (ref 30.0–36.0)
MCV: 88.8 fL (ref 80.0–100.0)
Monocytes Absolute: 0.8 10*3/uL (ref 0.1–1.0)
Monocytes Relative: 7 %
Neutro Abs: 6.6 10*3/uL (ref 1.7–7.7)
Neutrophils Relative %: 65 %
Platelets: 286 10*3/uL (ref 150–400)
RBC: 4.98 MIL/uL (ref 4.22–5.81)
RDW: 12.4 % (ref 11.5–15.5)
WBC: 10.4 10*3/uL (ref 4.0–10.5)
nRBC: 0 % (ref 0.0–0.2)

## 2021-03-06 LAB — COMPREHENSIVE METABOLIC PANEL
ALT: 24 U/L (ref 0–44)
AST: 18 U/L (ref 15–41)
Albumin: 3.7 g/dL (ref 3.5–5.0)
Alkaline Phosphatase: 51 U/L (ref 38–126)
Anion gap: 6 (ref 5–15)
BUN: 9 mg/dL (ref 6–20)
CO2: 24 mmol/L (ref 22–32)
Calcium: 8.8 mg/dL — ABNORMAL LOW (ref 8.9–10.3)
Chloride: 110 mmol/L (ref 98–111)
Creatinine, Ser: 0.93 mg/dL (ref 0.61–1.24)
GFR, Estimated: >60 mL/min (ref >60)
Glucose, Bld: 93 mg/dL (ref 70–99)
Potassium: 3.8 mmol/L (ref 3.5–5.1)
Sodium: 140 mmol/L (ref 135–145)
Total Bilirubin: 0.3 mg/dL (ref 0.3–1.2)
Total Protein: 7.1 g/dL (ref 6.5–8.1)

## 2021-03-06 LAB — TROPONIN I (HIGH SENSITIVITY): Troponin I (High Sensitivity): 2 ng/L (ref <18)

## 2021-03-06 LAB — D-DIMER, QUANTITATIVE: D-Dimer, Quant: 1.45 ug/mL-FEU — ABNORMAL HIGH (ref 0.00–0.50)

## 2021-03-06 LAB — BRAIN NATRIURETIC PEPTIDE: B Natriuretic Peptide: 29.2 pg/mL (ref 0.0–100.0)

## 2021-03-06 MED ORDER — AMOXICILLIN-POT CLAVULANATE 875-125 MG PO TABS: 1.0000 | ORAL_TABLET | Freq: Two times a day (BID) | ORAL | 0 refills | Status: AC

## 2021-03-06 MED ORDER — IOHEXOL 350 MG/ML SOLN
100.0000 mL | Freq: Once | INTRAVENOUS | Status: AC | PRN
  Administered 2021-03-06: 10:00:00 100 mL via INTRAVENOUS

## 2021-03-06 NOTE — ED Triage Notes (Signed)
Pt reports he tested positive for Covid on the 10th. Pt reports increased chest congestion, productive cough, and some sob.

## 2021-03-06 NOTE — ED Provider Notes (Signed)
[facility] EMERGENCY DEPARTMENT Provider Note   CSN: [id number] Arrival date & time: 03/06/21  0848     History Chief Complaint  Patient presents with   Shortness of Breath   Cough   Covid Positive    Andre Perez is a 36 y.o. male.  HPI    Tested positive for COVID last week Past few days worsening congestion, shortness of breath Last night felt lightheaded Sometimes the shortness of breath is worse than others Coughing and congestion at night, if clears mucus will feel a little better Taking pseudoephedrine regularly, thinks it may be helping Had fevers initially, no recent fevers Sore throat No nausea, vomiting will vomit if coughing episode.  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/19/2020   Obstructive sleep apnea syndrome 04/19/2020   Vitamin D deficiency 11/17/2019   Insulin resistance 11/17/2019   Visceral obesity 11/17/2019   Depression 11/17/2019    Past Surgical History:  Procedure Laterality Date   TESTICLE REMOVAL     TESTICULAR PROSTHETIC INSERTION         Family History  Problem Relation Age of Onset   Diabetes Mother    Sleep apnea Mother    Obesity Mother    Hypertension Father    Hyperlipidemia Father    Depression Father    Anxiety disorder Father    Sleep apnea Father    Obesity Father     Social History   Tobacco Use   Smoking status: Never   Smokeless tobacco: Never  Substance Use Topics   Alcohol use: Yes    Comment: occassionally    Drug use: Never    Home Medications Prior to Admission medications   Medication Sig Start Date End Date Taking? Authorizing Provider  amoxicillin-clavulanate (AUGMENTIN) 875-125 MG tablet Take 1 tablet by mouth every 12 (twelve) hours for 10 days. 03/06/21 03/16/21 Yes Alvira Monday, MD  clomiPHENE (CLOMID) 50 MG tablet Take 25 mg by mouth daily. 11/15/20   [provider]  Multiple Vitamins-Minerals (MULTIVITAMIN & MINERAL PO) Take by mouth.    [provider]  Naproxen Sodium (ALEVE PO) Take by mouth as needed.    [provider]  pantoprazole (PROTONIX) 40 MG tablet Take 40 mg by mouth daily as needed. 07/18/19   [provider]  tirzepatide Greggory Keen) 10 MG/0.5ML Pen Inject 10 mg into the skin once a week. 02/03/21   Helane Rima, DO  traZODone (DESYREL) 50 MG tablet TAKE ONE TABLET BY MOUTH AT BEDTIME 02/03/21   Helane Rima, DO    Allergies    Cephalosporins  Review of Systems   Review of Systems  Constitutional:  Positive for fatigue. Negative for fever.  HENT:  Positive for congestion and sore throat.   Eyes:  Negative for visual disturbance.  Respiratory:  Positive for cough and shortness of breath.   Cardiovascular:  Positive for chest pain (pressure with deep breaths but  not really pain).  Gastrointestinal:  Negative for abdominal pain, nausea and vomiting.  Genitourinary:  Negative for difficulty urinating.  Musculoskeletal:  Negative for back pain and neck stiffness.  Skin:  Negative for rash.  Neurological:  Positive for light-headedness and headaches. Negative for syncope.   Physical Exam Updated Vital Signs BP 130/88    Pulse 94    Temp 98.2 F (36.8 C) (Oral)    Resp 18    Ht 6\' 2"  (1.88 m)    Wt (!) 138.3 kg    SpO2 96%    BMI 39.16 kg/m   Physical Exam Vitals and nursing note reviewed.  Constitutional:      General: He is not in acute distress.    Appearance: He is well-developed. He is not diaphoretic.  HENT:      Head: Normocephalic and atraumatic.  Eyes:     Conjunctiva/sclera: Conjunctivae normal.  Cardiovascular:     Rate and Rhythm: Normal rate and regular rhythm.     Heart sounds: Normal heart sounds. No murmur heard.   No friction rub. No gallop.  Pulmonary:     Effort: Pulmonary effort is normal. No respiratory distress.     Breath sounds: Normal breath sounds. No wheezing or rales.  Abdominal:     General: There is no distension.     Palpations: Abdomen is soft.     Tenderness: There is no abdominal tenderness. There is no guarding.  Musculoskeletal:     Cervical back: Normal range of motion.  Skin:    General: Skin is warm and dry.  Neurological:     Mental Status: He is alert and oriented to person, place, and time.    ED Results / Procedures / Treatments   Labs (all labs ordered are listed, but only abnormal results are displayed) Labs Reviewed  COMPREHENSIVE METABOLIC PANEL - Abnormal; Notable for the following components:      Result Value   Calcium 8.8 (*)    All other components within normal limits  D-DIMER, QUANTITATIVE - Abnormal; Notable for the following components:   D-Dimer, Quant 1.45 (*)    All other components within normal limits  CBC WITH DIFFERENTIAL/PLATELET  BRAIN NATRIURETIC PEPTIDE  TROPONIN I (HIGH SENSITIVITY)    EKG EKG Interpretation  Date/Time:  Sunday March 06 2021 09:17:25 EST Ventricular Rate:  102 PR Interval:  168 QRS Duration: 102 QT Interval:  343 QTC Calculation: 447 R Axis:   30 Text Interpretation: Sinus tachycardia Posterior infarct, old No previous ECGs available Confirmed by Alvira MondaySchlossman, Jereme Loren ([id number]) on 03/06/2021 9:36:48 PM  Radiology CT Angio Chest PE W and/or Wo Contrast  Result Date: 03/06/2021 CLINICAL DATA:  Pulmonary embolism suspected. Positive D-dimer. Increased chest congestion, productive cough, and shortness of breath. EXAM: CT ANGIOGRAPHY CHEST WITH CONTRAST TECHNIQUE: Multidetector CT imaging of the chest  was performed using the standard protocol during bolus administration of intravenous contrast. Multiplanar CT image reconstructions and MIPs were obtained to evaluate the vascular anatomy. CONTRAST:  100mL OMNIPAQUE IOHEXOL 350 MG/ML SOLN COMPARISON:  Chest x-ray March 06, 2021 FINDINGS: Cardiovascular: Satisfactory opacification of the pulmonary arteries to the segmental level. No evidence of pulmonary embolism. Normal heart size. No pericardial effusion. Mediastinum/Nodes: No enlarged mediastinal, hilar, or axillary lymph nodes. Thyroid gland, trachea, and esophagus demonstrate no significant findings. Lungs/Pleura: Central airways are normal. No pneumothorax. There is a 4 mm nodule in the right middle lobe on series 5, image 51. No other nodules. No masses or infiltrates. Upper Abdomen:  No acute abnormality. Musculoskeletal: No chest wall abnormality. No acute or significant osseous findings. Review of the MIP images confirms the above findings. IMPRESSION: 1. No pulmonary emboli. No cause for the patient's symptoms identified. 2. 4 mm nodule in the right middle lobe. No follow-up needed if patient is low-risk. Non-contrast chest CT can be considered in 12 months if patient is high-risk. This recommendation follows the consensus statement: Guidelines for Management of Incidental Pulmonary Nodules Detected on CT Images: From the Fleischner Society 2017; Radiology 2017; 284:228-243. Electronically Signed   By: Dorise Bullion III M.D.   On: 03/06/2021 10:47   DG Chest Portable 1 View  Result Date: 03/06/2021 CLINICAL DATA:  Cough.  Tested positive for COVID-19 on 02/26/2021. EXAM: PORTABLE CHEST 1 VIEW COMPARISON:  None. FINDINGS: Cardiac silhouette is normal in size. Normal mediastinal and hilar contours. Clear lungs.  No pleural effusion or pneumothorax. Skeletal structures are grossly intact. IMPRESSION: No active disease. Electronically Signed   By: Lajean Manes M.D.   On: 03/06/2021 09:36     Procedures Procedures   Medications Ordered in ED Medications  iohexol (OMNIPAQUE) 350 MG/ML injection 100 mL (100 mLs Intravenous Contrast Given 03/06/21 1018)    ED Course  I have reviewed the triage vital signs and the nursing notes.  Pertinent labs & imaging results that were available during my care of the patient were reviewed by me and considered in my medical decision making (see chart for details).    MDM Rules/Calculators/A&P                          35 year old male with history above, presents with concern for shortness of breath and worsening congestion in the setting of testing positive for COVID 19 12/10.  Differential diagnosis for dyspnea includes ACS, PE, COPD exacerbation, CHF exacerbation, anemia, pneumonia, viral etiology such as COVID 19 infection, metabolic abnormality.  Chest x-ray was done which showed no evidence of pneumonia, pulmonary edema, pneumothorax. EKG was evaluated by me which showed normal sinus rhythm without other acute ST changes.Marland Kitchen  BNP was within normal limits, with no clinical signs of congestive heart failure.  Troponin negative and doubt ACS.  Has family history of protein S and thromboembolism, D-dimer is positive, and CTA PE study was ordered which shows no PE Has 11mm nodule, no follow up needed if low risk.  Symptoms likely secondary to congestion. Has sinus tenderness, greater than one week of symptoms, suspect bacterial sinusitis. Given rx for augmentin.  Patient discharged in stable condition with understanding of reasons to return.      Final Clinical Impression(s) / ED Diagnoses Final diagnoses:  COVID-19  Acute non-recurrent maxillary sinusitis  Shortness of breath  Pulmonary nodule    Rx / DC Orders ED Discharge Orders          Ordered    amoxicillin-clavulanate (AUGMENTIN) 875-125 MG tablet  Every 12 hours        03/06/21 1054             Gareth Morgan, MD 03/06/21 2137

## 2021-03-07 DIAGNOSIS — M7541 Impingement syndrome of right shoulder: Secondary | ICD-10-CM | POA: Diagnosis not present

## 2021-03-07 DIAGNOSIS — M25511 Pain in right shoulder: Secondary | ICD-10-CM | POA: Diagnosis not present

## 2021-03-08 ENCOUNTER — Encounter (INDEPENDENT_AMBULATORY_CARE_PROVIDER_SITE_OTHER): Payer: Self-pay | Admitting: Family Medicine

## 2021-03-08 ENCOUNTER — Other Ambulatory Visit (INDEPENDENT_AMBULATORY_CARE_PROVIDER_SITE_OTHER): Payer: Self-pay | Admitting: Family Medicine

## 2021-03-08 ENCOUNTER — Other Ambulatory Visit (HOSPITAL_COMMUNITY): Payer: Self-pay

## 2021-03-08 DIAGNOSIS — R7301 Impaired fasting glucose: Secondary | ICD-10-CM

## 2021-03-08 MED ORDER — TIRZEPATIDE 10 MG/0.5ML ~~LOC~~ SOAJ
10.0000 mg | SUBCUTANEOUS | 0 refills | Status: DC
  Filled 2021-03-08: qty 2, 28d supply, fill #0

## 2021-03-09 ENCOUNTER — Ambulatory Visit (INDEPENDENT_AMBULATORY_CARE_PROVIDER_SITE_OTHER): Payer: BC Managed Care – PPO | Admitting: Family Medicine

## 2021-03-10 ENCOUNTER — Other Ambulatory Visit (HOSPITAL_COMMUNITY): Payer: Self-pay

## 2021-03-11 DIAGNOSIS — M7541 Impingement syndrome of right shoulder: Secondary | ICD-10-CM | POA: Diagnosis not present

## 2021-03-11 DIAGNOSIS — M25511 Pain in right shoulder: Secondary | ICD-10-CM | POA: Diagnosis not present

## 2021-03-15 DIAGNOSIS — M25511 Pain in right shoulder: Secondary | ICD-10-CM | POA: Diagnosis not present

## 2021-03-15 DIAGNOSIS — M7541 Impingement syndrome of right shoulder: Secondary | ICD-10-CM | POA: Diagnosis not present

## 2021-03-18 DIAGNOSIS — E291 Testicular hypofunction: Secondary | ICD-10-CM | POA: Diagnosis not present

## 2021-03-21 DIAGNOSIS — M25511 Pain in right shoulder: Secondary | ICD-10-CM | POA: Diagnosis not present

## 2021-03-21 DIAGNOSIS — M7541 Impingement syndrome of right shoulder: Secondary | ICD-10-CM | POA: Diagnosis not present

## 2021-03-24 ENCOUNTER — Other Ambulatory Visit (HOSPITAL_COMMUNITY): Payer: Self-pay

## 2021-03-24 ENCOUNTER — Ambulatory Visit (INDEPENDENT_AMBULATORY_CARE_PROVIDER_SITE_OTHER): Payer: BC Managed Care – PPO | Admitting: Family Medicine

## 2021-03-24 ENCOUNTER — Encounter (INDEPENDENT_AMBULATORY_CARE_PROVIDER_SITE_OTHER): Payer: Self-pay | Admitting: Family Medicine

## 2021-03-24 ENCOUNTER — Other Ambulatory Visit: Payer: Self-pay

## 2021-03-24 VITALS — BP 122/82 | HR 99 | Temp 97.8°F | Ht 74.0 in | Wt 310.0 lb

## 2021-03-24 DIAGNOSIS — E65 Localized adiposity: Secondary | ICD-10-CM

## 2021-03-24 DIAGNOSIS — Z6839 Body mass index (BMI) 39.0-39.9, adult: Secondary | ICD-10-CM

## 2021-03-24 DIAGNOSIS — F418 Other specified anxiety disorders: Secondary | ICD-10-CM | POA: Diagnosis not present

## 2021-03-24 DIAGNOSIS — Z6841 Body Mass Index (BMI) 40.0 and over, adult: Secondary | ICD-10-CM

## 2021-03-24 DIAGNOSIS — R7301 Impaired fasting glucose: Secondary | ICD-10-CM | POA: Diagnosis not present

## 2021-03-24 MED ORDER — TIRZEPATIDE 10 MG/0.5ML ~~LOC~~ SOAJ
10.0000 mg | SUBCUTANEOUS | 2 refills | Status: DC
  Filled 2021-03-24 – 2021-04-14 (×2): qty 2, 28d supply, fill #0

## 2021-03-25 DIAGNOSIS — M7541 Impingement syndrome of right shoulder: Secondary | ICD-10-CM | POA: Diagnosis not present

## 2021-03-25 DIAGNOSIS — M25511 Pain in right shoulder: Secondary | ICD-10-CM | POA: Diagnosis not present

## 2021-03-25 NOTE — Progress Notes (Signed)
I, Christoper Fabian, LAT, ATC, am serving as scribe for Dr. [name].  Andre Perez is a 36 y.o. male who presents to [facility] today for f/u of chronic R shoulder pain thought to be due to subacromial bursitis and impingement.  He was last seen by Dr. Denyse Amass on 01/31/21 and was referred to PT at [facility], completing 11 visits.  Today, pt reports slight improvement in R shoulder pain, rating the overall progress to 75%. Pt c/o continued pain w/ overhead motions. Pt notes he is now able to sleep on his R-side. 3 weeks ago, pt was feeling 95% better, but the past 2 weeks has regressed.    Diagnostic testing: R shoulder XR- 01/31/21  Pertinent review of systems: No fevers or chills  Relevant historical information: Sleep apnea   Exam:  BP 124/78    Pulse 89    Ht 6\' 2"  (1.88 m)    Wt (!) 311 lb (141.1 kg)    SpO2 98%    BMI 39.93 kg/m  General: Well Developed, well nourished, and in no acute distress.   MSK: Right shoulder normal-appearing Nontender. Pain with abduction and internal rotation. Positive impingement testing    Lab and Radiology Results  Procedure: Real-time Ultrasound Guided Injection of right shoulder subacromial bursa Device: Philips Affiniti 50G Images permanently stored and available for review in PACS Verbal informed consent obtained.  Discussed risks and benefits of procedure. Warned about infection bleeding damage to structures skin hypopigmentation and fat atrophy among others. Patient expresses understanding and agreement Time-out conducted.   Noted no overlying erythema, induration, or other signs of local infection.   Skin prepped in a sterile fashion.   Local anesthesia: Topical Ethyl chloride.   With sterile technique and under real time ultrasound guidance: 40 mg of Kenalog and 2 mL of Marcaine injected into subacromial bursa. Fluid seen entering the bursa.   Completed without difficulty   Pain immediately  resolved suggesting accurate placement of the medication.   Advised to call if fevers/chills, erythema, induration, drainage, or persistent bleeding.   Images permanently stored and available for review in the ultrasound unit.  Impression: Technically successful ultrasound guided injection.       EXAM: RIGHT SHOULDER - 2+ VIEW   COMPARISON:  None.   FINDINGS: There is no evidence of fracture or dislocation. There is no evidence of arthropathy or other focal bone abnormality, but with incidentally noted tiny clustered calcific loose bodies inferiorly in the [facility] joint space. Soft tissues are unremarkable.   IMPRESSION: 1. No evidence of fractures, dislocation or significant arthropathy. 2. Clustered tiny loose bodies in the inferior aspect of the [facility] joint.     Electronically Signed   By: [facility] M.D.   On: 01/31/2021 21:35 I, 02/02/2021, personally (independently) visualized and performed the interpretation of the images attached in this note.     Assessment and Plan: 36 y.o. male with right shoulder pain due to subacromial bursitis.  Patient has improved about 75% with PT.  However he was about 95% better and now is getting worse.  Plan for subacromial injection and continued home exercise program.  If he continues to worsen or fail to improve would consider MRI.  He will let me know.   PDMP not reviewed this encounter. Orders Placed This Encounter  Procedures   30 LIMITED JOINT SPACE STRUCTURES UP RIGHT(NO LINKED CHARGES)    Standing Status:   Future  Number of Occurrences:   1    Standing Expiration Date:   09/25/2021    Order Specific Question:   Reason for Exam (SYMPTOM  OR DIAGNOSIS REQUIRED)    Answer:   right shoulder pain    Order Specific Question:   Preferred imaging location?    Answer:   [facility]   No orders of the defined types were placed in this encounter.    Discussed warning signs or symptoms. Please see discharge  instructions. Patient expresses understanding.   The above documentation has been reviewed and is accurate and complete [name], M.D.

## 2021-03-28 ENCOUNTER — Ambulatory Visit: Payer: Self-pay

## 2021-03-28 ENCOUNTER — Other Ambulatory Visit: Payer: Self-pay

## 2021-03-28 ENCOUNTER — Ambulatory Visit (INDEPENDENT_AMBULATORY_CARE_PROVIDER_SITE_OTHER): Payer: BC Managed Care – PPO | Admitting: Family Medicine

## 2021-03-28 VITALS — BP 124/78 | HR 89 | Ht 74.0 in | Wt 311.0 lb

## 2021-03-28 DIAGNOSIS — G8929 Other chronic pain: Secondary | ICD-10-CM

## 2021-03-28 DIAGNOSIS — M25511 Pain in right shoulder: Secondary | ICD-10-CM

## 2021-03-28 NOTE — Patient Instructions (Addendum)
Thank you for coming in today.   You received a steroid injection today. Seek immediate medical attention if the joint becomes red, extremely painful, or is oozing fluid.   Let me know if not better and we can proceed to MRI  Recheck back as needed

## 2021-03-28 NOTE — Progress Notes (Signed)
Chief Complaint:   OBESITY Andre Perez is here to discuss his progress with his obesity treatment plan along with follow-up of his obesity related diagnoses. See Medical Weight Management Flowsheet for complete bioelectrical impedance results.  Today's visit was #: 19 Starting weight: 353 lbs Starting date: 10/28/2019 Weight change since last visit: 6 lbs Total lbs lost to date: 43 lbs Total weight loss percentage to date: -12.18%  Nutrition Plan: Category 4 Plan for 60% of the time.  Activity: Sports for 45 minutes 2 times per week.  Anti-obesity medications: Mounjaro 10 mg subcutaneously weekly. Reported side effects: None.  Interim History: Andre Perez had COVID over the holidays.  He says he feels stronger and is sleeping better.  Assessment/Plan:   1. Impaired fasting glucose Andre Perez is taking Mounjaro 10 mg subcutaneously weekly.  Plan: Continue Mounjaro 10 mg subcutaneously weekly, as per below.  - Refill tirzepatide (MOUNJARO) 10 MG/0.5ML Pen; Inject 10 mg into the skin once a week.  Dispense: 2 mL; Refill: 2  2. Visceral obesity Current visceral fat rating: 19. Visceral fat rating goal is < 13. Visceral adipose tissue is a hormonally active component of total body fat. This body composition phenotype is associated with medical disorders such as metabolic syndrome, cardiovascular disease, and several malignancies including prostate, breast, and colorectal cancers. Starting goal: Lose 7-10% of starting weight.   3. Situational anxiety Behavior modification techniques were discussed today to help deal with emotional/non-hunger eating behaviors. Will continue to monitor as it relates to his weight loss journey.  4. Obesity, current BMI 39.9  Course: Andre Perez is currently in the action stage of change. As such, his goal is to continue with weight loss efforts.   Nutrition goals: He has agreed to the Category 4 Plan.   Exercise goals: For substantial health benefits, adults  should do at least 150 minutes (2 hours and 30 minutes) a week of moderate-intensity, or 75 minutes (1 hour and 15 minutes) a week of vigorous-intensity aerobic physical activity, or an equivalent combination of moderate- and vigorous-intensity aerobic activity. Aerobic activity should be performed in episodes of at least 10 minutes, and preferably, it should be spread throughout the week.  Behavioral modification strategies: increasing lean protein intake, decreasing simple carbohydrates, increasing vegetables, increasing water intake, and decreasing liquid calories.  Andre Perez has agreed to follow-up with our clinic in 4 weeks. He was informed of the importance of frequent follow-up visits to maximize his success with intensive lifestyle modifications for his multiple health conditions.   Objective:   Blood pressure 122/82, pulse 99, temperature 97.8 F (36.6 C), temperature source Oral, height 6\' 2"  (1.88 m), weight (!) 310 lb (140.6 kg), SpO2 98 %. Body mass index is 39.8 kg/m.  General: Cooperative, alert, well developed, in no acute distress. HEENT: Conjunctivae and lids unremarkable. Cardiovascular: Regular rhythm.  Lungs: Normal work of breathing. Neurologic: No focal deficits.   Lab Results  Component Value Date   CREATININE 0.93 03/06/2021   BUN 9 03/06/2021   NA 140 03/06/2021   K 3.8 03/06/2021   CL 110 03/06/2021   CO2 24 03/06/2021   Lab Results  Component Value Date   ALT 24 03/06/2021   AST 18 03/06/2021   ALKPHOS 51 03/06/2021   BILITOT 0.3 03/06/2021   No results found for: HGBA1C Lab Results  Component Value Date   INSULIN 26.1 (H) 05/17/2020   INSULIN 23.9 10/28/2019   Lab Results  Component Value Date   TSH 1.300 05/17/2020  Lab Results  Component Value Date   CHOL 153 05/17/2020   HDL 39 (L) 05/17/2020   LDLCALC 94 05/17/2020   TRIG 110 05/17/2020   CHOLHDL 3.9 05/17/2020   Lab Results  Component Value Date   VD25OH 28.0 (L) 05/17/2020    VD25OH 22.5 (L) 10/28/2019   Lab Results  Component Value Date   WBC 10.4 03/06/2021   HGB 15.2 03/06/2021   HCT 44.2 03/06/2021   MCV 88.8 03/06/2021   PLT 286 03/06/2021   Attestation Statements:   Reviewed by clinician on day of visit: allergies, medications, problem list, medical history, surgical history, family history, social history, and previous encounter notes.  I, Insurance claims handler, CMA, am acting as transcriptionist for Helane Rima, DO  I have reviewed the above documentation for accuracy and completeness, and I agree with the above. -  Helane Rima, DO, MS, FAAFP, DABOM - Family and Bariatric Medicine.

## 2021-04-09 ENCOUNTER — Other Ambulatory Visit (INDEPENDENT_AMBULATORY_CARE_PROVIDER_SITE_OTHER): Payer: Self-pay | Admitting: Family Medicine

## 2021-04-09 DIAGNOSIS — G4709 Other insomnia: Secondary | ICD-10-CM

## 2021-04-11 ENCOUNTER — Encounter (INDEPENDENT_AMBULATORY_CARE_PROVIDER_SITE_OTHER): Payer: Self-pay

## 2021-04-11 NOTE — Telephone Encounter (Signed)
Dr.Wallace °

## 2021-04-11 NOTE — Telephone Encounter (Signed)
Msg sent to pt 

## 2021-04-14 ENCOUNTER — Other Ambulatory Visit (HOSPITAL_COMMUNITY): Payer: Self-pay

## 2021-04-16 ENCOUNTER — Telehealth: Payer: BC Managed Care – PPO | Admitting: Family

## 2021-04-16 DIAGNOSIS — H109 Unspecified conjunctivitis: Secondary | ICD-10-CM

## 2021-04-16 MED ORDER — POLYMYXIN B-TRIMETHOPRIM 10000-0.1 UNIT/ML-% OP SOLN: 1.0000 [drp] | Freq: Four times a day (QID) | OPHTHALMIC | 0 refills | Status: AC

## 2021-04-16 NOTE — Progress Notes (Signed)
Virtual Visit Consent   Andre Perez, you are scheduled for a virtual visit with a [facility] provider today.     Just as with appointments in the office, your consent must be obtained to participate.  Your consent will be active for this visit and any virtual visit you may have with one of our providers in the next 365 days.     If you have a MyChart account, a copy of this consent can be sent to you electronically.  All virtual visits are billed to your insurance company just like a traditional visit in the office.    As this is a virtual visit, video technology does not allow for your provider to perform a traditional examination.  This may limit your provider's ability to fully assess your condition.  If your provider identifies any concerns that need to be evaluated in person or the need to arrange testing (such as labs, EKG, etc.), we will make arrangements to do so.     Although advances in technology are sophisticated, we cannot ensure that it will always work on either your end or our end.  If the connection with a video visit is poor, the visit may have to be switched to a telephone visit.  With either a video or telephone visit, we are not always able to ensure that we have a secure connection.     I need to obtain your verbal consent now.   Are you willing to proceed with your visit today?    Dalvin Clipper Tolen has provided verbal consent on 04/16/2021 for a virtual visit (video or telephone).   Jannifer Rodney, FNP   Date: 04/16/2021 9:04 AM   Virtual Visit via Video Note   I, Jannifer Rodney, connected with  Andre Perez  ([id number], 1986/02/15) on 04/16/21 at  9:00 AM EST by a video-enabled telemedicine application and verified that I am speaking with the correct person using two identifiers.  Location: Patient: Virtual Visit Location Patient: Home Provider: Virtual Visit Location Provider: Home Office   I discussed the limitations of evaluation and management by  telemedicine and the availability of in person appointments. The patient expressed understanding and agreed to proceed.    History of Present Illness: RETT Perez is a 36 y.o. who identifies as a male who was assigned male at birth, and is being seen today for redness in his eye. His wife and three kids have had pink eye last week.   HPI: Conjunctivitis  The current episode started today. The onset was sudden. The problem occurs continuously. The problem has been gradually worsening. The problem is moderate. Associated symptoms include eye itching, photophobia, eye discharge, eye pain and eye redness. Pertinent negatives include no decreased vision, no double vision, no ear pain, no rhinorrhea and no sore throat. The left eye is affected.   Problems:  Patient Active Problem List   Diagnosis Date Noted   Gastroesophageal reflux disease 04/19/2020   Gynecomastia 04/19/2020   Lipoprotein deficiency disorder 04/19/2020   Obstructive sleep apnea syndrome 04/19/2020   Vitamin D deficiency 11/17/2019   Insulin resistance 11/17/2019   Visceral obesity 11/17/2019   Depression 11/17/2019    Allergies:  Allergies  Allergen Reactions   Cephalosporins Anaphylaxis and Swelling   Medications:  Current Outpatient Medications:    trimethoprim-polymyxin b (POLYTRIM) ophthalmic solution, Place 1 drop into the left eye every 6 (six) hours., Disp: 10 mL, Rfl: 0   clomiPHENE (CLOMID) 50 MG tablet, Take 25  mg by mouth 2 (two) times a week., Disp: , Rfl:    Multiple Vitamins-Minerals (MULTIVITAMIN & MINERAL PO), Take by mouth., Disp: , Rfl:    Naproxen Sodium (ALEVE PO), Take by mouth as needed., Disp: , Rfl:    pantoprazole (PROTONIX) 40 MG tablet, Take 40 mg by mouth daily as needed., Disp: , Rfl:    tirzepatide (MOUNJARO) 10 MG/0.5ML Pen, Inject 0.5 mL into the skin once a week., Disp: 2 mL, Rfl: 2   traZODone (DESYREL) 50 MG tablet, TAKE ONE TABLET BY MOUTH AT BEDTIME, Disp: 30 tablet, Rfl:  0  Observations/Objective: Patient is well-developed, well-nourished in no acute distress.  Resting comfortably  at home.  Head is normocephalic, atraumatic.  No labored breathing.  Speech is clear and coherent with logical content.  Patient is alert and oriented at baseline.  Left eye erythemas  Assessment and Plan: 1. Bacterial conjunctivitis - trimethoprim-polymyxin b (POLYTRIM) ophthalmic solution; Place 1 drop into the left eye every 6 (six) hours.  Dispense: 10 mL; Refill: 0  Good hand hygiene  Warm compresses  Avoid rubbing eye Follow up if symptoms worsen or do not improve   Follow Up Instructions: I discussed the assessment and treatment plan with the patient. The patient was provided an opportunity to ask questions and all were answered. The patient agreed with the plan and demonstrated an understanding of the instructions.  A copy of instructions were sent to the patient via MyChart unless otherwise noted below.    The patient was advised to call back or seek an in-person evaluation if the symptoms worsen or if the condition fails to improve as anticipated.  Time:  I spent 7 minutes with the patient via telehealth technology discussing the above problems/concerns.    Jannifer Rodney, FNP

## 2021-05-02 ENCOUNTER — Other Ambulatory Visit (HOSPITAL_COMMUNITY): Payer: Self-pay

## 2021-05-02 ENCOUNTER — Other Ambulatory Visit: Payer: Self-pay

## 2021-05-02 ENCOUNTER — Ambulatory Visit (INDEPENDENT_AMBULATORY_CARE_PROVIDER_SITE_OTHER): Payer: BC Managed Care – PPO | Admitting: Family Medicine

## 2021-05-02 ENCOUNTER — Encounter (INDEPENDENT_AMBULATORY_CARE_PROVIDER_SITE_OTHER): Payer: Self-pay | Admitting: Family Medicine

## 2021-05-02 VITALS — BP 138/81 | HR 88 | Temp 98.0°F | Ht 74.0 in | Wt 297.0 lb

## 2021-05-02 DIAGNOSIS — Z6838 Body mass index (BMI) 38.0-38.9, adult: Secondary | ICD-10-CM

## 2021-05-02 DIAGNOSIS — R7989 Other specified abnormal findings of blood chemistry: Secondary | ICD-10-CM | POA: Diagnosis not present

## 2021-05-02 DIAGNOSIS — R7301 Impaired fasting glucose: Secondary | ICD-10-CM

## 2021-05-02 DIAGNOSIS — E669 Obesity, unspecified: Secondary | ICD-10-CM

## 2021-05-02 DIAGNOSIS — G4709 Other insomnia: Secondary | ICD-10-CM

## 2021-05-02 DIAGNOSIS — Z6841 Body Mass Index (BMI) 40.0 and over, adult: Secondary | ICD-10-CM

## 2021-05-02 MED ORDER — TRAZODONE HCL 50 MG PO TABS
50.0000 mg | ORAL_TABLET | Freq: Every day | ORAL | 2 refills | Status: DC
Start: 2021-05-02 — End: 2023-11-22
  Filled 2021-05-02: qty 90, 90d supply, fill #0
  Filled 2021-08-18 – 2022-02-18 (×2): qty 90, 90d supply, fill #1

## 2021-05-02 MED ORDER — TIRZEPATIDE 10 MG/0.5ML ~~LOC~~ SOAJ
10.0000 mg | SUBCUTANEOUS | 2 refills | Status: AC
  Filled 2021-05-02: qty 6, 84d supply, fill #0
  Filled 2021-05-17: qty 2, 28d supply, fill #0

## 2021-05-03 NOTE — Progress Notes (Signed)
Chief Complaint:   OBESITY Andre Perez is here to discuss his progress with his obesity treatment plan along with follow-up of his obesity related diagnoses. See Medical Weight Management Flowsheet for complete bioelectrical impedance results.  Today's visit was #: 20 Starting weight: 353 lbs Starting date: 10/28/2019 Weight change since last visit: 13 lbs Total lbs lost to date: 56 lbs Total weight loss percentage to date: -15.86%  Nutrition Plan: Category 4 Plan for 70% of the time.  Activity: Sports for 45 minutes 2 times per week. Anti-obesity medications: Mounjaro 10 mg subcutaneously weekly. Reported side effects: None.  Interim History: Andre Perez says he is happy with the current plan.  Assessment/Plan:   1. Impaired fasting glucose Andre Perez is taking Mounjaro 10 mg subcutaneously weekly.  Plan:  Continue Mounjaro 10 mg .subuc weekly.  Will refill today, as per below.  - Refill tirzepatide (MOUNJARO) 10 MG/0.5ML Pen; Inject 1 pen (0.5 mL) into the skin once a week.  Dispense: 6 mL; Refill: 2  2. Other insomnia This is well controlled.  Average hours of sleep per night: 8. Current treatment: trazodone 50 mg at bedtime.   Plan: Continue trazodone.  Will refill today.  Recommend sleep hygiene measures including regular sleep schedule, optimal sleep environment, and relaxing presleep rituals.   - Refill traZODone (DESYREL) 50 MG tablet; Take 1 tablet (50 mg total) by mouth at bedtime.  Dispense: 90 tablet; Refill: 2  3. Low testosterone Andre Perez is taking Clomid 25 mg daily.  We will continue to monitor symptoms as they relate to his weight loss journey.  4. Obesity, current BMI 38.2  Course: Andre Perez is currently in the action stage of change. As such, his goal is to continue with weight loss efforts.   Nutrition goals: He has agreed to the Category 4 Plan.   Exercise goals:  As is.  Behavioral modification strategies: increasing lean protein intake, decreasing simple  carbohydrates, increasing vegetables, and increasing water intake.  Andre Perez has agreed to follow-up with our clinic in 4 weeks. He was informed of the importance of frequent follow-up visits to maximize his success with intensive lifestyle modifications for his multiple health conditions.   Objective:   Blood pressure 138/81, pulse 88, temperature 98 F (36.7 C), temperature source Oral, height 6\' 2"  (1.88 m), weight 297 lb (134.7 kg), SpO2 97 %. Body mass index is 38.13 kg/m.  General: Cooperative, alert, well developed, in no acute distress. HEENT: Conjunctivae and lids unremarkable. Cardiovascular: Regular rhythm.  Lungs: Normal work of breathing. Neurologic: No focal deficits.   Lab Results  Component Value Date   CREATININE 0.93 03/06/2021   BUN 9 03/06/2021   NA 140 03/06/2021   K 3.8 03/06/2021   CL 110 03/06/2021   CO2 24 03/06/2021   Lab Results  Component Value Date   ALT 24 03/06/2021   AST 18 03/06/2021   ALKPHOS 51 03/06/2021   BILITOT 0.3 03/06/2021   Lab Results  Component Value Date   INSULIN 26.1 (H) 05/17/2020   INSULIN 23.9 10/28/2019   Lab Results  Component Value Date   TSH 1.300 05/17/2020   Lab Results  Component Value Date   CHOL 153 05/17/2020   HDL 39 (L) 05/17/2020   LDLCALC 94 05/17/2020   TRIG 110 05/17/2020   CHOLHDL 3.9 05/17/2020   Lab Results  Component Value Date   VD25OH 28.0 (L) 05/17/2020   VD25OH 22.5 (L) 10/28/2019   Lab Results  Component Value Date   WBC  10.4 03/06/2021   HGB 15.2 03/06/2021   HCT 44.2 03/06/2021   MCV 88.8 03/06/2021   PLT 286 03/06/2021   Attestation Statements:   Reviewed by clinician on day of visit: allergies, medications, problem list, medical history, surgical history, family history, social history, and previous encounter notes.  I, Insurance claims handler, CMA, am acting as transcriptionist for Helane Rima, DO  I have reviewed the above documentation for accuracy and completeness, and I agree  with the above. -  Helane Rima, DO, MS, FAAFP, DABOM - Family and Bariatric Medicine.

## 2021-05-04 ENCOUNTER — Other Ambulatory Visit (HOSPITAL_COMMUNITY): Payer: Self-pay

## 2021-05-04 ENCOUNTER — Ambulatory Visit (INDEPENDENT_AMBULATORY_CARE_PROVIDER_SITE_OTHER): Payer: BC Managed Care – PPO | Admitting: Family Medicine

## 2021-05-17 ENCOUNTER — Other Ambulatory Visit (HOSPITAL_COMMUNITY): Payer: Self-pay

## 2021-05-23 ENCOUNTER — Encounter (INDEPENDENT_AMBULATORY_CARE_PROVIDER_SITE_OTHER): Payer: Self-pay | Admitting: Family Medicine

## 2021-05-23 ENCOUNTER — Other Ambulatory Visit (HOSPITAL_COMMUNITY): Payer: Self-pay

## 2021-05-24 DIAGNOSIS — Z20822 Contact with and (suspected) exposure to covid-19: Secondary | ICD-10-CM | POA: Diagnosis not present

## 2021-05-24 NOTE — Telephone Encounter (Signed)
Prior authorization has been started for Mounjaro. Will notify patient and provider once response is received.  

## 2021-05-30 ENCOUNTER — Encounter (INDEPENDENT_AMBULATORY_CARE_PROVIDER_SITE_OTHER): Payer: Self-pay

## 2021-06-07 ENCOUNTER — Encounter: Payer: Self-pay | Admitting: Family Medicine

## 2021-06-08 ENCOUNTER — Other Ambulatory Visit: Payer: Self-pay

## 2021-06-08 DIAGNOSIS — G8929 Other chronic pain: Secondary | ICD-10-CM

## 2021-06-09 ENCOUNTER — Ambulatory Visit (INDEPENDENT_AMBULATORY_CARE_PROVIDER_SITE_OTHER): Payer: BC Managed Care – PPO | Admitting: Family Medicine

## 2021-06-18 ENCOUNTER — Ambulatory Visit (INDEPENDENT_AMBULATORY_CARE_PROVIDER_SITE_OTHER): Payer: BC Managed Care – PPO

## 2021-06-18 DIAGNOSIS — M25511 Pain in right shoulder: Secondary | ICD-10-CM | POA: Diagnosis not present

## 2021-06-18 DIAGNOSIS — G8929 Other chronic pain: Secondary | ICD-10-CM | POA: Diagnosis not present

## 2021-06-20 NOTE — Progress Notes (Signed)
? ?I, Christoper Fabian, LAT, ATC, am serving as scribe for Dr. [name]. ? ?Andre Perez is a 36 y.o. male who presents to [facility] at [facility] today for f/u of chronic R shoulder pain and to review his R shoulder MRI results.  He was last seen by Dr. Denyse Amass on 03/28/21 and after initial improvement in his symptoms, noted regression and return of R shoulder pain.  He had a R subacromial steroid injection and later messaged Dr. Denyse Amass indicating that his R shoulder pain returned again.  Today, pt reports that his R shoulder feels about the same.  He states that the prior injection helped significantly for about one month but his pain has gradually returned.  His most aggravating movements are R shoulder overhead AROM and horizontal aBd w/ ER. ? ?He completed PT at [facility]. He also had a subacromial injection for this pain.  ? ?Diagnostic testing: R shoulder MRI- 06/18/21; R shoulder XR- 01/31/21 ? ?Pertinent review of systems: no fever or chills ? ?Relevant historical information: Sleep apnea. ? ? ?Exam:  ?BP 106/82 (BP Location: Right Arm, Patient Position: Sitting, Cuff Size: Large)   Pulse 90   Ht 6\' 2"  (1.88 m)   Wt (!) 304 lb (137.9 kg)   SpO2 95%   BMI 39.03 kg/m?  ?General: Well Developed, well nourished, and in no acute distress.  ? ?MSK: Slight decreased range of motion. ? ? ? ?Lab and Radiology Results ?No results found for this or any previous visit (from the past 72 hour(s)). ?MR SHOULDER RIGHT WO CONTRAST ? ?Result Date: 06/19/2021 ?CLINICAL DATA:  Right shoulder pain for 2 years.  No known injury. EXAM: MRI OF THE RIGHT SHOULDER WITHOUT CONTRAST TECHNIQUE: Multiplanar, multisequence MR imaging of the shoulder was performed. No intravenous contrast was administered. COMPARISON:  X-ray 01/31/2021 FINDINGS: Rotator cuff: Mild supraspinatus tendinosis with focal bursal sided tear along the anterior aspect of the distal tendon involving between 25-50% of the tendon depth  (series 7, image 15). Tear measures 5 mm in AP dimension. Mild infraspinatus tendinosis. Subscapularis and teres minor tendons within normal limits. No full-thickness or retracted rotator cuff tear. Muscles: Preserved bulk and signal intensity of the rotator cuff musculature without edema, atrophy, or fatty infiltration. Biceps long head:  Intact and appropriately positioned. Acromioclavicular Joint: No significant degenerative changes of the AC joint. Trace subacromial-subdeltoid bursal fluid. Glenohumeral Joint: No joint effusion. No cartilage defect. Labrum: Grossly intact although evaluation is limited in the absence of intra-articular fluid. No paralabral cyst. Bones: No acute fracture. No dislocation. No bone marrow edema. No marrow replacing bone lesion. Other: None. IMPRESSION: 1. Mild supraspinatus tendinosis with small focal bursal sided tear, as described. 2. Mild infraspinatus tendinosis. 3. Mild subacromial-subdeltoid bursitis. Electronically Signed   By: 02/02/2021 D.O.   On: 06/19/2021 20:19   ? ? ?I, 08/19/2021, personally (independently) visualized and performed the interpretation of the images attached in this note. ? ? ?Assessment and Plan: ?36 y.o. male with right shoulder pain due to small supraspinatus rotator cuff tear and rotator cuff tendinitis and bursitis.  This pain has been ongoing for years and unfortunately he has not done as well as I would help with a conservative management trial of physical therapy and a subacromial injection.  The injection only lasted for about a month.  At this point I think it is reasonable to have a surgical consultation to discuss his options.  He likely would improve with subacromial  decompression and potential rotator cuff repair.  However I think he should have a discussion with an orthopedic surgeon to discuss the pros and cons of surgery and recovery time etc.  He can make a decision about whether or not to have surgery or to proceed with further  conservative management options. ? ?Recheck back with me as needed. ? ? ?PDMP not reviewed this encounter. ?Orders Placed This Encounter  ?Procedures  ? Ambulatory referral to Orthopedic Surgery  ?  Referral Priority:   Routine  ?  Referral Type:   Surgical  ?  Referral Reason:   Specialty Services Required  ?  Requested Specialty:   Orthopedic Surgery  ?  Number of Visits Requested:   1  ? ?No orders of the defined types were placed in this encounter. ? ? ? ?Discussed warning signs or symptoms. Please see discharge instructions. Patient expresses understanding. ? ? ?The above documentation has been reviewed and is accurate and complete [name], M.D. ? ?Total encounter time 20 minutes including face-to-face time with the patient and, reviewing past medical record, and charting on the date of service.   ?MRI findings and imaging, treatment plan and options. ? ? ?

## 2021-06-20 NOTE — Progress Notes (Signed)
MRI shows mild rotator cuff tendinitis of the supraspinatus tendon and a small focal tear as well as some mild tendinitis and bursitis of the other rotator cuff tendons.  Recommend return to clinic with me in the near future to go over the results and discuss treatment plan and options.

## 2021-06-21 ENCOUNTER — Encounter: Payer: Self-pay | Admitting: Family Medicine

## 2021-06-21 ENCOUNTER — Ambulatory Visit (INDEPENDENT_AMBULATORY_CARE_PROVIDER_SITE_OTHER): Payer: BC Managed Care – PPO | Admitting: Family Medicine

## 2021-06-21 VITALS — BP 106/82 | HR 90 | Ht 74.0 in | Wt 304.0 lb

## 2021-06-21 DIAGNOSIS — M7551 Bursitis of right shoulder: Secondary | ICD-10-CM

## 2021-06-21 DIAGNOSIS — G8929 Other chronic pain: Secondary | ICD-10-CM | POA: Diagnosis not present

## 2021-06-21 DIAGNOSIS — M25511 Pain in right shoulder: Secondary | ICD-10-CM | POA: Diagnosis not present

## 2021-06-21 DIAGNOSIS — M75111 Incomplete rotator cuff tear or rupture of right shoulder, not specified as traumatic: Secondary | ICD-10-CM

## 2021-06-21 NOTE — Patient Instructions (Addendum)
Good to see you today. ? ?I've referred you to [facility] for consultation.  Their office will call you to schedule. ? ?Follow-up: as needed ?

## 2021-06-23 ENCOUNTER — Ambulatory Visit (INDEPENDENT_AMBULATORY_CARE_PROVIDER_SITE_OTHER): Payer: BC Managed Care – PPO | Admitting: Family Medicine

## 2021-06-23 ENCOUNTER — Other Ambulatory Visit (HOSPITAL_COMMUNITY): Payer: Self-pay

## 2021-06-23 ENCOUNTER — Encounter (INDEPENDENT_AMBULATORY_CARE_PROVIDER_SITE_OTHER): Payer: Self-pay | Admitting: Family Medicine

## 2021-06-23 VITALS — BP 120/81 | HR 87 | Temp 97.8°F | Ht 74.0 in | Wt 297.0 lb

## 2021-06-23 DIAGNOSIS — R7989 Other specified abnormal findings of blood chemistry: Secondary | ICD-10-CM

## 2021-06-23 DIAGNOSIS — Z6838 Body mass index (BMI) 38.0-38.9, adult: Secondary | ICD-10-CM

## 2021-06-23 DIAGNOSIS — R7301 Impaired fasting glucose: Secondary | ICD-10-CM | POA: Diagnosis not present

## 2021-06-23 DIAGNOSIS — R632 Polyphagia: Secondary | ICD-10-CM

## 2021-06-23 DIAGNOSIS — Z6841 Body Mass Index (BMI) 40.0 and over, adult: Secondary | ICD-10-CM

## 2021-06-23 DIAGNOSIS — E669 Obesity, unspecified: Secondary | ICD-10-CM

## 2021-06-23 DIAGNOSIS — E291 Testicular hypofunction: Secondary | ICD-10-CM | POA: Diagnosis not present

## 2021-06-23 MED ORDER — TIRZEPATIDE 12.5 MG/0.5ML ~~LOC~~ SOAJ
12.5000 mg | SUBCUTANEOUS | 3 refills | Status: DC
  Filled 2021-06-23: qty 2, 28d supply, fill #0
  Filled 2021-07-20: qty 2, 28d supply, fill #1

## 2021-06-23 MED ORDER — TIRZEPATIDE 15 MG/0.5ML ~~LOC~~ SOAJ
15.0000 mg | SUBCUTANEOUS | 3 refills | Status: DC
  Filled 2021-06-23 – 2021-08-18 (×2): qty 2, 28d supply, fill #0
  Filled 2021-09-08 – 2021-10-05 (×2): qty 2, 28d supply, fill #1

## 2021-06-23 MED ORDER — PHENTERMINE HCL 37.5 MG PO TABS
18.7500 mg | ORAL_TABLET | Freq: Every day | ORAL | 0 refills | Status: DC
  Filled 2021-06-23: qty 30, 30d supply, fill #0

## 2021-06-28 NOTE — Progress Notes (Signed)
Chief Complaint:   OBESITY Andre Perez is here to discuss his progress with his obesity treatment plan along with follow-up of his obesity related diagnoses. See Medical Weight Management Flowsheet for complete bioelectrical impedance results.  Today's visit was #: 21 Starting weight: 353 lbs Starting date: 10/28/2019 Weight change since last visit: 0 Total lbs lost to date: 56 lbs Total weight loss percentage to date: -15.86%  Nutrition Plan: Category 4 Plan for 50% of the time.  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 Anti-obesity medications: Mounjaro 10 mg subcutaneously weekly. Reported side effects: None.  Interim History: Andre Perez endorses increased hunger on busy days.  Assessment/Plan:   1. Impaired fasting glucose Andre Perez is currently taking Mounjaro 10 mg subcutaneously weekly.  Plan:  Increase Mounjaro to 12.5 mg subcutaneously weekly, as per below.  - Increase tirzepatide (MOUNJARO) 12.5 MG/0.5ML Pen; Inject 12.5 mg into the skin once a week.  Dispense: 2 mL; Refill: 3 - tirzepatide (MOUNJARO) 15 MG/0.5ML Pen; Inject 15 mg into the skin once a week.  Dispense: 2 mL; Refill: 3  2. Polyphagia Not optimized. Current treatment: None.    Plan: Start phentermine 18.75-37.5 mg daily.  He will continue to focus on protein-rich, low simple carbohydrate foods. We reviewed the importance of hydration, regular exercise for stress reduction, and restorative sleep.  - Start phentermine (ADIPEX-P) 37.5 MG tablet; Take 1/2-1 tablets (18.75-37.5 mg total) by mouth daily before breakfast.  Dispense: 30 tablet; Refill: 0  We reviewed potential side effects including insomnia, dry mouth, increased heart rate and blood pressure, and increased anxiety. We reviewed reducing caffeine consumption while taking phentermine, especially if the patient is experiencing side effects. Alternative treatment options were discussed. All questions were answered, and the patient  wishes to move forward with this medication.   I have consulted the [place] Controlled Substances Registry for this patient, and feel the risk/benefit ratio today is favorable for proceeding with this prescription for a controlled substance. The patient understands monitoring parameters and red flags.   3. Low testosterone Improving. Followed by PCP. Andre Perez is taking Clomid 25 mg daily.  We will continue to monitor symptoms as they relate to his weight loss journey.  4. Obesity, current BMI 38.2  Course: Andre Perez is currently in the action stage of change. As such, his goal is to continue with weight loss efforts.   Nutrition goals: He has agreed to the Category 4 Plan.   Exercise goals:  As is.  Behavioral modification strategies: increasing lean protein intake, decreasing simple carbohydrates, increasing vegetables, and increasing water intake.  Andre Perez has agreed to follow-up with our clinic in 6 weeks. He was informed of the importance of frequent follow-up visits to maximize his success with intensive lifestyle modifications for his multiple health conditions.   Objective:   Blood pressure 120/81, pulse 87, temperature 97.8 F (36.6 C), temperature source Oral, height 6\' 2"  (1.88 m), weight 297 lb (134.7 kg), SpO2 98 %. Body mass index is 38.13 kg/m.  General: Cooperative, alert, well developed, in no acute distress. HEENT: Conjunctivae and lids unremarkable. Cardiovascular: Regular rhythm.  Lungs: Normal work of breathing. Neurologic: No focal deficits.   Lab Results  Component Value Date   CREATININE 0.93 03/06/2021   BUN 9 03/06/2021   NA 140 03/06/2021   K 3.8 03/06/2021   CL 110 03/06/2021   CO2 24 03/06/2021   Lab Results  Component Value Date   ALT 24 03/06/2021   AST 18 03/06/2021  ALKPHOS 51 03/06/2021   BILITOT 0.3 03/06/2021   Lab Results  Component Value Date   INSULIN 26.1 (H) 05/17/2020   INSULIN 23.9 10/28/2019   Lab Results  Component Value Date    TSH 1.300 05/17/2020   Lab Results  Component Value Date   CHOL 153 05/17/2020   HDL 39 (L) 05/17/2020   LDLCALC 94 05/17/2020   TRIG 110 05/17/2020   CHOLHDL 3.9 05/17/2020   Lab Results  Component Value Date   VD25OH 28.0 (L) 05/17/2020   VD25OH 22.5 (L) 10/28/2019   Lab Results  Component Value Date   WBC 10.4 03/06/2021   HGB 15.2 03/06/2021   HCT 44.2 03/06/2021   MCV 88.8 03/06/2021   PLT 286 03/06/2021   Attestation Statements:   Reviewed by clinician on day of visit: allergies, medications, problem list, medical history, surgical history, family history, social history, and previous encounter notes.  I, Water quality scientist, CMA, am acting as transcriptionist for Briscoe Deutscher, DO  I have reviewed the above documentation for accuracy and completeness, and I agree with the above. -  Briscoe Deutscher, DO, MS, FAAFP, DABOM - Family and Bariatric Medicine.

## 2021-06-29 ENCOUNTER — Encounter (INDEPENDENT_AMBULATORY_CARE_PROVIDER_SITE_OTHER): Payer: Self-pay

## 2021-07-20 ENCOUNTER — Other Ambulatory Visit (HOSPITAL_COMMUNITY): Payer: Self-pay

## 2021-07-21 DIAGNOSIS — M75101 Unspecified rotator cuff tear or rupture of right shoulder, not specified as traumatic: Secondary | ICD-10-CM | POA: Diagnosis not present

## 2021-07-21 DIAGNOSIS — M7531 Calcific tendinitis of right shoulder: Secondary | ICD-10-CM | POA: Diagnosis not present

## 2021-08-18 ENCOUNTER — Other Ambulatory Visit (INDEPENDENT_AMBULATORY_CARE_PROVIDER_SITE_OTHER): Payer: Self-pay

## 2021-08-18 ENCOUNTER — Other Ambulatory Visit (HOSPITAL_COMMUNITY): Payer: Self-pay

## 2021-08-18 DIAGNOSIS — E291 Testicular hypofunction: Secondary | ICD-10-CM | POA: Diagnosis not present

## 2021-08-18 DIAGNOSIS — G4700 Insomnia, unspecified: Secondary | ICD-10-CM | POA: Diagnosis not present

## 2021-08-18 DIAGNOSIS — R632 Polyphagia: Secondary | ICD-10-CM | POA: Diagnosis not present

## 2021-08-18 DIAGNOSIS — R7309 Other abnormal glucose: Secondary | ICD-10-CM | POA: Diagnosis not present

## 2021-08-19 ENCOUNTER — Other Ambulatory Visit (HOSPITAL_COMMUNITY): Payer: Self-pay

## 2021-08-22 DIAGNOSIS — M7531 Calcific tendinitis of right shoulder: Secondary | ICD-10-CM | POA: Diagnosis not present

## 2021-09-08 ENCOUNTER — Other Ambulatory Visit (HOSPITAL_COMMUNITY): Payer: Self-pay

## 2021-09-13 ENCOUNTER — Other Ambulatory Visit (HOSPITAL_COMMUNITY): Payer: Self-pay

## 2021-10-03 ENCOUNTER — Other Ambulatory Visit (HOSPITAL_COMMUNITY): Payer: Self-pay

## 2021-10-03 DIAGNOSIS — R632 Polyphagia: Secondary | ICD-10-CM | POA: Diagnosis not present

## 2021-10-03 DIAGNOSIS — G4700 Insomnia, unspecified: Secondary | ICD-10-CM | POA: Diagnosis not present

## 2021-10-03 DIAGNOSIS — R7309 Other abnormal glucose: Secondary | ICD-10-CM | POA: Diagnosis not present

## 2021-10-03 DIAGNOSIS — K5900 Constipation, unspecified: Secondary | ICD-10-CM | POA: Diagnosis not present

## 2021-10-03 MED ORDER — TRAZODONE HCL 50 MG PO TABS
ORAL_TABLET | ORAL | 0 refills | Status: DC
  Filled 2021-10-03 – 2021-11-06 (×4): qty 90, 90d supply, fill #0

## 2021-10-03 MED ORDER — CLOMIPHENE CITRATE 50 MG PO TABS
ORAL_TABLET | ORAL | 1 refills | Status: DC
  Filled 2021-10-03: qty 12, 84d supply, fill #0
  Filled 2021-11-06 – 2022-02-18 (×2): qty 12, 84d supply, fill #1

## 2021-10-03 MED ORDER — MOUNJARO 15 MG/0.5ML ~~LOC~~ SOAJ
SUBCUTANEOUS | 0 refills | Status: DC
  Filled 2021-10-03 – 2022-02-21 (×2): qty 2, 28d supply, fill #0

## 2021-10-04 ENCOUNTER — Other Ambulatory Visit (HOSPITAL_COMMUNITY): Payer: Self-pay

## 2021-10-04 DIAGNOSIS — M7531 Calcific tendinitis of right shoulder: Secondary | ICD-10-CM | POA: Diagnosis not present

## 2021-10-04 MED ORDER — PHENTERMINE HCL 37.5 MG PO TABS
18.7500 mg | ORAL_TABLET | Freq: Every day | ORAL | 0 refills | Status: DC
Start: 2021-10-04 — End: 2022-08-24
  Filled 2021-10-04: qty 90, 90d supply, fill #0
  Filled 2021-10-07: qty 90, fill #0
  Filled 2021-10-10 – 2022-02-18 (×2): qty 90, 90d supply, fill #0

## 2021-10-05 ENCOUNTER — Other Ambulatory Visit (HOSPITAL_COMMUNITY): Payer: Self-pay

## 2021-10-07 ENCOUNTER — Other Ambulatory Visit (HOSPITAL_COMMUNITY): Payer: Self-pay

## 2021-10-11 ENCOUNTER — Other Ambulatory Visit (HOSPITAL_COMMUNITY): Payer: Self-pay

## 2021-10-12 ENCOUNTER — Other Ambulatory Visit (HOSPITAL_COMMUNITY): Payer: Self-pay

## 2021-10-13 ENCOUNTER — Other Ambulatory Visit (HOSPITAL_COMMUNITY): Payer: Self-pay

## 2021-10-14 ENCOUNTER — Other Ambulatory Visit (HOSPITAL_COMMUNITY): Payer: Self-pay

## 2021-10-26 ENCOUNTER — Encounter (INDEPENDENT_AMBULATORY_CARE_PROVIDER_SITE_OTHER): Payer: Self-pay

## 2021-11-01 ENCOUNTER — Other Ambulatory Visit (HOSPITAL_COMMUNITY): Payer: Self-pay

## 2021-11-01 DIAGNOSIS — E65 Localized adiposity: Secondary | ICD-10-CM | POA: Diagnosis not present

## 2021-11-01 DIAGNOSIS — E669 Obesity, unspecified: Secondary | ICD-10-CM | POA: Diagnosis not present

## 2021-11-01 DIAGNOSIS — M25511 Pain in right shoulder: Secondary | ICD-10-CM | POA: Diagnosis not present

## 2021-11-01 DIAGNOSIS — R7309 Other abnormal glucose: Secondary | ICD-10-CM | POA: Diagnosis not present

## 2021-11-01 MED ORDER — MOUNJARO 15 MG/0.5ML ~~LOC~~ SOAJ
SUBCUTANEOUS | 2 refills | Status: DC
  Filled 2021-11-01: qty 2, 28d supply, fill #0
  Filled 2022-01-19: qty 2, 28d supply, fill #1

## 2021-11-04 ENCOUNTER — Other Ambulatory Visit (HOSPITAL_COMMUNITY): Payer: Self-pay

## 2021-11-07 ENCOUNTER — Other Ambulatory Visit (HOSPITAL_COMMUNITY): Payer: Self-pay

## 2021-11-15 ENCOUNTER — Other Ambulatory Visit (HOSPITAL_COMMUNITY): Payer: Self-pay

## 2021-11-24 DIAGNOSIS — Z0000 Encounter for general adult medical examination without abnormal findings: Secondary | ICD-10-CM | POA: Diagnosis not present

## 2021-12-01 ENCOUNTER — Other Ambulatory Visit (HOSPITAL_COMMUNITY): Payer: Self-pay

## 2021-12-02 DIAGNOSIS — E786 Lipoprotein deficiency: Secondary | ICD-10-CM | POA: Diagnosis not present

## 2021-12-13 ENCOUNTER — Other Ambulatory Visit (HOSPITAL_COMMUNITY): Payer: Self-pay

## 2021-12-14 ENCOUNTER — Other Ambulatory Visit (HOSPITAL_COMMUNITY): Payer: Self-pay

## 2021-12-14 DIAGNOSIS — R7309 Other abnormal glucose: Secondary | ICD-10-CM | POA: Diagnosis not present

## 2021-12-14 DIAGNOSIS — R632 Polyphagia: Secondary | ICD-10-CM | POA: Diagnosis not present

## 2021-12-14 DIAGNOSIS — J40 Bronchitis, not specified as acute or chronic: Secondary | ICD-10-CM | POA: Diagnosis not present

## 2021-12-14 DIAGNOSIS — E291 Testicular hypofunction: Secondary | ICD-10-CM | POA: Diagnosis not present

## 2021-12-14 MED ORDER — CLOMIPHENE CITRATE 50 MG PO TABS
25.0000 mg | ORAL_TABLET | ORAL | 0 refills | Status: DC
  Filled 2021-12-14: qty 12, 84d supply, fill #0

## 2021-12-14 MED ORDER — MOUNJARO 15 MG/0.5ML ~~LOC~~ SOAJ
15.0000 mg | SUBCUTANEOUS | 0 refills | Status: DC
  Filled 2021-12-14: qty 2, 28d supply, fill #0

## 2021-12-14 MED ORDER — AZITHROMYCIN 250 MG PO TABS
ORAL_TABLET | ORAL | 0 refills | Status: DC
  Filled 2021-12-14: qty 6, 5d supply, fill #0

## 2021-12-15 ENCOUNTER — Other Ambulatory Visit (HOSPITAL_COMMUNITY): Payer: Self-pay

## 2021-12-22 ENCOUNTER — Other Ambulatory Visit (HOSPITAL_COMMUNITY): Payer: Self-pay

## 2022-01-18 DIAGNOSIS — E669 Obesity, unspecified: Secondary | ICD-10-CM | POA: Diagnosis not present

## 2022-01-18 DIAGNOSIS — E291 Testicular hypofunction: Secondary | ICD-10-CM | POA: Diagnosis not present

## 2022-01-18 DIAGNOSIS — R632 Polyphagia: Secondary | ICD-10-CM | POA: Diagnosis not present

## 2022-01-18 DIAGNOSIS — R7989 Other specified abnormal findings of blood chemistry: Secondary | ICD-10-CM | POA: Diagnosis not present

## 2022-01-19 ENCOUNTER — Other Ambulatory Visit (HOSPITAL_COMMUNITY): Payer: Self-pay

## 2022-02-20 ENCOUNTER — Other Ambulatory Visit (HOSPITAL_COMMUNITY): Payer: Self-pay

## 2022-02-21 ENCOUNTER — Other Ambulatory Visit (HOSPITAL_COMMUNITY): Payer: Self-pay

## 2022-02-22 ENCOUNTER — Encounter (HOSPITAL_COMMUNITY): Payer: Self-pay

## 2022-02-22 ENCOUNTER — Other Ambulatory Visit (HOSPITAL_COMMUNITY): Payer: Self-pay

## 2022-02-22 MED ORDER — CLOMID 50 MG PO TABS
25.0000 mg | ORAL_TABLET | ORAL | 0 refills | Status: DC
  Filled 2022-02-22 – 2022-11-15 (×4): qty 12, 84d supply, fill #0

## 2022-02-22 MED ORDER — TRAZODONE HCL 50 MG PO TABS
50.0000 mg | ORAL_TABLET | Freq: Every evening | ORAL | 0 refills | Status: DC | PRN
  Filled 2022-02-22: qty 90, 90d supply, fill #0

## 2022-02-25 ENCOUNTER — Other Ambulatory Visit (HOSPITAL_COMMUNITY): Payer: Self-pay

## 2022-02-27 ENCOUNTER — Other Ambulatory Visit (HOSPITAL_COMMUNITY): Payer: Self-pay

## 2022-02-27 DIAGNOSIS — G4709 Other insomnia: Secondary | ICD-10-CM | POA: Diagnosis not present

## 2022-02-27 DIAGNOSIS — E669 Obesity, unspecified: Secondary | ICD-10-CM | POA: Diagnosis not present

## 2022-02-27 DIAGNOSIS — E291 Testicular hypofunction: Secondary | ICD-10-CM | POA: Diagnosis not present

## 2022-02-28 ENCOUNTER — Other Ambulatory Visit (HOSPITAL_COMMUNITY): Payer: Self-pay

## 2022-02-28 MED ORDER — TRAZODONE HCL 50 MG PO TABS
50.0000 mg | ORAL_TABLET | Freq: Every day | ORAL | 0 refills | Status: DC
  Filled 2022-02-28 – 2022-11-15 (×3): qty 90, 90d supply, fill #0

## 2022-02-28 MED ORDER — CLOMID 50 MG PO TABS
25.0000 mg | ORAL_TABLET | ORAL | 0 refills | Status: DC
  Filled 2022-02-28: qty 11, 77d supply, fill #0
  Filled 2022-05-13: qty 24, 90d supply, fill #0

## 2022-02-28 MED ORDER — ZEPBOUND 15 MG/0.5ML ~~LOC~~ SOAJ
15.0000 mg | SUBCUTANEOUS | 0 refills | Status: DC
  Filled 2022-02-28 – 2022-03-07 (×3): qty 2, 28d supply, fill #0
  Filled 2022-04-13: qty 2, 28d supply, fill #1
  Filled 2022-05-13: qty 2, 28d supply, fill #2

## 2022-03-07 ENCOUNTER — Other Ambulatory Visit (HOSPITAL_COMMUNITY): Payer: Self-pay

## 2022-03-23 ENCOUNTER — Other Ambulatory Visit (HOSPITAL_COMMUNITY): Payer: Self-pay

## 2022-03-23 DIAGNOSIS — R0981 Nasal congestion: Secondary | ICD-10-CM | POA: Diagnosis not present

## 2022-03-23 DIAGNOSIS — J3489 Other specified disorders of nose and nasal sinuses: Secondary | ICD-10-CM | POA: Diagnosis not present

## 2022-03-23 DIAGNOSIS — Z20828 Contact with and (suspected) exposure to other viral communicable diseases: Secondary | ICD-10-CM | POA: Diagnosis not present

## 2022-03-23 DIAGNOSIS — J111 Influenza due to unidentified influenza virus with other respiratory manifestations: Secondary | ICD-10-CM | POA: Diagnosis not present

## 2022-03-23 DIAGNOSIS — R5383 Other fatigue: Secondary | ICD-10-CM | POA: Diagnosis not present

## 2022-03-23 DIAGNOSIS — R030 Elevated blood-pressure reading, without diagnosis of hypertension: Secondary | ICD-10-CM | POA: Diagnosis not present

## 2022-03-23 MED ORDER — PANTOPRAZOLE SODIUM 40 MG PO TBEC
40.0000 mg | DELAYED_RELEASE_TABLET | Freq: Every day | ORAL | 0 refills | Status: DC | PRN
  Filled 2022-03-23: qty 90, 90d supply, fill #0

## 2022-04-13 ENCOUNTER — Other Ambulatory Visit (HOSPITAL_COMMUNITY): Payer: Self-pay

## 2022-04-14 ENCOUNTER — Other Ambulatory Visit (HOSPITAL_COMMUNITY): Payer: Self-pay

## 2022-04-14 ENCOUNTER — Encounter (HOSPITAL_COMMUNITY): Payer: Self-pay

## 2022-04-27 DIAGNOSIS — J020 Streptococcal pharyngitis: Secondary | ICD-10-CM | POA: Diagnosis not present

## 2022-04-27 DIAGNOSIS — Z6833 Body mass index (BMI) 33.0-33.9, adult: Secondary | ICD-10-CM | POA: Diagnosis not present

## 2022-04-27 DIAGNOSIS — J029 Acute pharyngitis, unspecified: Secondary | ICD-10-CM | POA: Diagnosis not present

## 2022-05-13 ENCOUNTER — Other Ambulatory Visit (HOSPITAL_COMMUNITY): Payer: Self-pay

## 2022-05-19 ENCOUNTER — Other Ambulatory Visit (HOSPITAL_COMMUNITY): Payer: Self-pay

## 2022-05-22 ENCOUNTER — Other Ambulatory Visit (HOSPITAL_COMMUNITY): Payer: Self-pay

## 2022-05-22 MED ORDER — CLOMID 50 MG PO TABS
ORAL_TABLET | ORAL | 0 refills | Status: DC
  Filled 2022-05-22: qty 24, 90d supply, fill #0
  Filled 2022-08-24: qty 24, 30d supply, fill #0
  Filled 2022-08-24: qty 12, 84d supply, fill #0
  Filled 2022-11-15: qty 12, 84d supply, fill #1

## 2022-05-24 ENCOUNTER — Other Ambulatory Visit (HOSPITAL_COMMUNITY): Payer: Self-pay

## 2022-05-25 ENCOUNTER — Other Ambulatory Visit (HOSPITAL_COMMUNITY): Payer: Self-pay

## 2022-05-30 ENCOUNTER — Other Ambulatory Visit (HOSPITAL_COMMUNITY): Payer: Self-pay

## 2022-05-30 DIAGNOSIS — E786 Lipoprotein deficiency: Secondary | ICD-10-CM | POA: Diagnosis not present

## 2022-05-30 DIAGNOSIS — G4709 Other insomnia: Secondary | ICD-10-CM | POA: Diagnosis not present

## 2022-05-30 DIAGNOSIS — E669 Obesity, unspecified: Secondary | ICD-10-CM | POA: Diagnosis not present

## 2022-05-30 DIAGNOSIS — R632 Polyphagia: Secondary | ICD-10-CM | POA: Diagnosis not present

## 2022-05-30 MED ORDER — ZEPBOUND 15 MG/0.5ML ~~LOC~~ SOAJ
SUBCUTANEOUS | 0 refills | Status: DC
  Filled 2022-05-30 – 2022-06-20 (×2): qty 2, 28d supply, fill #0
  Filled 2022-08-24 (×2): qty 2, 28d supply, fill #1
  Filled 2023-01-22: qty 2, 28d supply, fill #2

## 2022-05-30 MED ORDER — TRAZODONE HCL 50 MG PO TABS
ORAL_TABLET | ORAL | 0 refills | Status: DC
  Filled 2022-05-30: qty 90, 90d supply, fill #0

## 2022-05-31 ENCOUNTER — Other Ambulatory Visit (HOSPITAL_COMMUNITY): Payer: Self-pay

## 2022-05-31 DIAGNOSIS — S42022A Displaced fracture of shaft of left clavicle, initial encounter for closed fracture: Secondary | ICD-10-CM | POA: Diagnosis not present

## 2022-05-31 DIAGNOSIS — W228XXA Striking against or struck by other objects, initial encounter: Secondary | ICD-10-CM | POA: Diagnosis not present

## 2022-06-01 DIAGNOSIS — S42002D Fracture of unspecified part of left clavicle, subsequent encounter for fracture with routine healing: Secondary | ICD-10-CM | POA: Diagnosis not present

## 2022-06-06 DIAGNOSIS — S42002D Fracture of unspecified part of left clavicle, subsequent encounter for fracture with routine healing: Secondary | ICD-10-CM | POA: Diagnosis not present

## 2022-06-07 DIAGNOSIS — Y999 Unspecified external cause status: Secondary | ICD-10-CM | POA: Diagnosis not present

## 2022-06-07 DIAGNOSIS — X58XXXA Exposure to other specified factors, initial encounter: Secondary | ICD-10-CM | POA: Diagnosis not present

## 2022-06-07 DIAGNOSIS — S42022A Displaced fracture of shaft of left clavicle, initial encounter for closed fracture: Secondary | ICD-10-CM | POA: Diagnosis not present

## 2022-06-20 ENCOUNTER — Other Ambulatory Visit (HOSPITAL_COMMUNITY): Payer: Self-pay

## 2022-06-23 ENCOUNTER — Other Ambulatory Visit (HOSPITAL_COMMUNITY): Payer: Self-pay

## 2022-06-29 DIAGNOSIS — G90512 Complex regional pain syndrome I of left upper limb: Secondary | ICD-10-CM | POA: Diagnosis not present

## 2022-07-04 DIAGNOSIS — M79645 Pain in left finger(s): Secondary | ICD-10-CM | POA: Diagnosis not present

## 2022-07-06 DIAGNOSIS — M79642 Pain in left hand: Secondary | ICD-10-CM | POA: Diagnosis not present

## 2022-07-07 DIAGNOSIS — M792 Neuralgia and neuritis, unspecified: Secondary | ICD-10-CM | POA: Diagnosis not present

## 2022-07-10 DIAGNOSIS — M79642 Pain in left hand: Secondary | ICD-10-CM | POA: Diagnosis not present

## 2022-07-11 DIAGNOSIS — M542 Cervicalgia: Secondary | ICD-10-CM | POA: Diagnosis not present

## 2022-07-11 DIAGNOSIS — G90519 Complex regional pain syndrome I of unspecified upper limb: Secondary | ICD-10-CM | POA: Diagnosis not present

## 2022-07-12 DIAGNOSIS — M79642 Pain in left hand: Secondary | ICD-10-CM | POA: Diagnosis not present

## 2022-07-17 DIAGNOSIS — M79642 Pain in left hand: Secondary | ICD-10-CM | POA: Diagnosis not present

## 2022-07-20 ENCOUNTER — Other Ambulatory Visit (HOSPITAL_COMMUNITY): Payer: Self-pay

## 2022-07-20 DIAGNOSIS — G4709 Other insomnia: Secondary | ICD-10-CM | POA: Diagnosis not present

## 2022-07-20 DIAGNOSIS — E559 Vitamin D deficiency, unspecified: Secondary | ICD-10-CM | POA: Diagnosis not present

## 2022-07-20 DIAGNOSIS — M898X1 Other specified disorders of bone, shoulder: Secondary | ICD-10-CM | POA: Diagnosis not present

## 2022-07-20 DIAGNOSIS — R632 Polyphagia: Secondary | ICD-10-CM | POA: Diagnosis not present

## 2022-07-20 DIAGNOSIS — M79642 Pain in left hand: Secondary | ICD-10-CM | POA: Diagnosis not present

## 2022-07-20 MED ORDER — ZEPBOUND 15 MG/0.5ML ~~LOC~~ SOAJ
15.0000 mg | SUBCUTANEOUS | 0 refills | Status: DC
  Filled 2022-07-20 – 2022-07-29 (×2): qty 2, 28d supply, fill #0

## 2022-07-20 MED ORDER — MELOXICAM 15 MG PO TABS
15.0000 mg | ORAL_TABLET | Freq: Every day | ORAL | 0 refills | Status: DC
  Filled 2022-07-20: qty 90, 90d supply, fill #0

## 2022-07-24 DIAGNOSIS — M79645 Pain in left finger(s): Secondary | ICD-10-CM | POA: Diagnosis not present

## 2022-07-27 DIAGNOSIS — M79642 Pain in left hand: Secondary | ICD-10-CM | POA: Diagnosis not present

## 2022-07-27 DIAGNOSIS — Z4789 Encounter for other orthopedic aftercare: Secondary | ICD-10-CM | POA: Diagnosis not present

## 2022-07-31 ENCOUNTER — Other Ambulatory Visit (HOSPITAL_COMMUNITY): Payer: Self-pay

## 2022-08-01 ENCOUNTER — Other Ambulatory Visit (HOSPITAL_COMMUNITY): Payer: Self-pay

## 2022-08-01 DIAGNOSIS — M79642 Pain in left hand: Secondary | ICD-10-CM | POA: Diagnosis not present

## 2022-08-04 DIAGNOSIS — M79642 Pain in left hand: Secondary | ICD-10-CM | POA: Diagnosis not present

## 2022-08-07 DIAGNOSIS — M79642 Pain in left hand: Secondary | ICD-10-CM | POA: Diagnosis not present

## 2022-08-15 DIAGNOSIS — M79642 Pain in left hand: Secondary | ICD-10-CM | POA: Diagnosis not present

## 2022-08-17 DIAGNOSIS — M79642 Pain in left hand: Secondary | ICD-10-CM | POA: Diagnosis not present

## 2022-08-24 ENCOUNTER — Other Ambulatory Visit (HOSPITAL_COMMUNITY): Payer: Self-pay

## 2022-08-24 MED ORDER — PHENTERMINE HCL 37.5 MG PO TABS
37.5000 mg | ORAL_TABLET | Freq: Every day | ORAL | 0 refills | Status: DC
  Filled 2022-08-24: qty 90, 90d supply, fill #0

## 2022-08-25 DIAGNOSIS — M79645 Pain in left finger(s): Secondary | ICD-10-CM | POA: Diagnosis not present

## 2022-08-29 ENCOUNTER — Other Ambulatory Visit: Payer: Self-pay

## 2022-08-29 ENCOUNTER — Other Ambulatory Visit (HOSPITAL_COMMUNITY): Payer: Self-pay

## 2022-08-29 DIAGNOSIS — M79642 Pain in left hand: Secondary | ICD-10-CM | POA: Diagnosis not present

## 2022-09-01 DIAGNOSIS — M79642 Pain in left hand: Secondary | ICD-10-CM | POA: Diagnosis not present

## 2022-09-05 DIAGNOSIS — M79645 Pain in left finger(s): Secondary | ICD-10-CM | POA: Diagnosis not present

## 2022-09-06 DIAGNOSIS — K219 Gastro-esophageal reflux disease without esophagitis: Secondary | ICD-10-CM | POA: Diagnosis not present

## 2022-09-06 DIAGNOSIS — G4709 Other insomnia: Secondary | ICD-10-CM | POA: Diagnosis not present

## 2022-09-06 DIAGNOSIS — M898X1 Other specified disorders of bone, shoulder: Secondary | ICD-10-CM | POA: Diagnosis not present

## 2022-09-08 ENCOUNTER — Other Ambulatory Visit (HOSPITAL_COMMUNITY): Payer: Self-pay

## 2022-09-08 MED ORDER — PANTOPRAZOLE SODIUM 40 MG PO TBEC
40.0000 mg | DELAYED_RELEASE_TABLET | Freq: Every day | ORAL | 3 refills | Status: DC | PRN
  Filled 2022-09-08: qty 30, 30d supply, fill #0
  Filled 2022-10-15: qty 30, 30d supply, fill #1
  Filled 2022-11-15: qty 30, 30d supply, fill #2
  Filled 2022-12-10: qty 30, 30d supply, fill #3

## 2022-09-08 MED ORDER — MELOXICAM 15 MG PO TABS
15.0000 mg | ORAL_TABLET | Freq: Every day | ORAL | 0 refills | Status: DC
  Filled 2022-09-08 – 2022-09-28 (×3): qty 90, 90d supply, fill #0

## 2022-09-08 MED ORDER — TRAZODONE HCL 50 MG PO TABS
50.0000 mg | ORAL_TABLET | Freq: Every evening | ORAL | 0 refills | Status: DC
  Filled 2022-09-08: qty 90, 90d supply, fill #0

## 2022-09-08 MED ORDER — ZEPBOUND 15 MG/0.5ML ~~LOC~~ SOAJ
15.0000 mg | SUBCUTANEOUS | 0 refills | Status: DC
  Filled 2022-09-28 (×2): qty 2, 28d supply, fill #0

## 2022-09-11 DIAGNOSIS — S42002D Fracture of unspecified part of left clavicle, subsequent encounter for fracture with routine healing: Secondary | ICD-10-CM | POA: Diagnosis not present

## 2022-09-14 ENCOUNTER — Other Ambulatory Visit (HOSPITAL_COMMUNITY): Payer: Self-pay

## 2022-09-18 DIAGNOSIS — G90519 Complex regional pain syndrome I of unspecified upper limb: Secondary | ICD-10-CM | POA: Diagnosis not present

## 2022-09-26 DIAGNOSIS — G90519 Complex regional pain syndrome I of unspecified upper limb: Secondary | ICD-10-CM | POA: Diagnosis not present

## 2022-09-26 DIAGNOSIS — M79642 Pain in left hand: Secondary | ICD-10-CM | POA: Diagnosis not present

## 2022-09-28 ENCOUNTER — Other Ambulatory Visit (HOSPITAL_COMMUNITY): Payer: Self-pay

## 2022-09-28 ENCOUNTER — Other Ambulatory Visit: Payer: Self-pay

## 2022-09-28 DIAGNOSIS — M79645 Pain in left finger(s): Secondary | ICD-10-CM | POA: Diagnosis not present

## 2022-09-28 MED ORDER — TRAZODONE HCL 50 MG PO TABS
50.0000 mg | ORAL_TABLET | Freq: Every day | ORAL | 1 refills | Status: DC
  Filled 2022-09-28 – 2023-03-06 (×2): qty 90, 90d supply, fill #0
  Filled 2023-06-13: qty 90, 90d supply, fill #1

## 2022-09-29 ENCOUNTER — Other Ambulatory Visit (HOSPITAL_COMMUNITY): Payer: Self-pay

## 2022-10-14 ENCOUNTER — Other Ambulatory Visit (HOSPITAL_COMMUNITY): Payer: Self-pay

## 2022-10-16 ENCOUNTER — Other Ambulatory Visit: Payer: Self-pay

## 2022-10-23 DIAGNOSIS — M79645 Pain in left finger(s): Secondary | ICD-10-CM | POA: Diagnosis not present

## 2022-11-07 DIAGNOSIS — S42002K Fracture of unspecified part of left clavicle, subsequent encounter for fracture with nonunion: Secondary | ICD-10-CM | POA: Diagnosis not present

## 2022-11-07 DIAGNOSIS — Z4789 Encounter for other orthopedic aftercare: Secondary | ICD-10-CM | POA: Diagnosis not present

## 2022-11-09 DIAGNOSIS — S42002K Fracture of unspecified part of left clavicle, subsequent encounter for fracture with nonunion: Secondary | ICD-10-CM | POA: Diagnosis not present

## 2022-11-09 DIAGNOSIS — Z4789 Encounter for other orthopedic aftercare: Secondary | ICD-10-CM | POA: Diagnosis not present

## 2022-11-13 ENCOUNTER — Other Ambulatory Visit (HOSPITAL_COMMUNITY): Payer: Self-pay

## 2022-11-15 ENCOUNTER — Other Ambulatory Visit (HOSPITAL_COMMUNITY): Payer: Self-pay

## 2022-11-16 ENCOUNTER — Other Ambulatory Visit (HOSPITAL_COMMUNITY): Payer: Self-pay

## 2022-11-16 ENCOUNTER — Other Ambulatory Visit: Payer: Self-pay

## 2022-11-17 DIAGNOSIS — K219 Gastro-esophageal reflux disease without esophagitis: Secondary | ICD-10-CM | POA: Diagnosis not present

## 2022-11-17 DIAGNOSIS — E291 Testicular hypofunction: Secondary | ICD-10-CM | POA: Diagnosis not present

## 2022-11-17 DIAGNOSIS — E7800 Pure hypercholesterolemia, unspecified: Secondary | ICD-10-CM | POA: Diagnosis not present

## 2022-11-17 DIAGNOSIS — R632 Polyphagia: Secondary | ICD-10-CM | POA: Diagnosis not present

## 2022-11-23 ENCOUNTER — Other Ambulatory Visit (HOSPITAL_COMMUNITY): Payer: Self-pay

## 2022-11-26 IMAGING — CT CT ANGIO CHEST
2 of 8 series · 18 of 36 positions shown · IV contrast (Omnipaque)
Comparison: Chest x-ray March 06, 2021

CLINICAL DATA: Pulmonary embolism suspected. Positive D-dimer.
Increased chest congestion, productive cough, and shortness of
breath.

EXAM:
CT ANGIOGRAPHY CHEST WITH CONTRAST
TECHNIQUE: Multidetector CT imaging of the chest was performed using the
standard protocol during bolus administration of intravenous
contrast. Multiplanar CT image reconstructions and MIPs were
obtained to evaluate the vascular anatomy.
CONTRAST:  100mL OMNIPAQUE IOHEXOL 350 MG/ML SOLN

[Series 6: pe coronal mpr · coronal · 0.59mm/px · 1 of 97 slices shown]
[im 49/97  mediastinal]
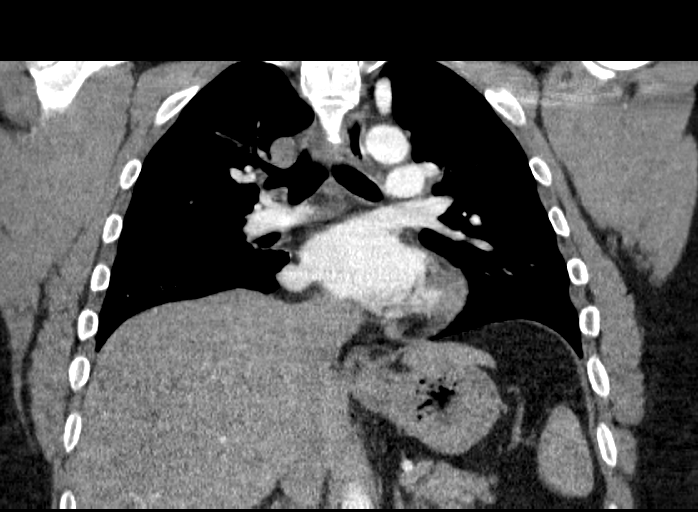

[Series 10: pe thins · axial · 0.80mm/px · z∈[+1093,+1358]mm · 17 of 394 slices shown]
[im 20/394  lung]
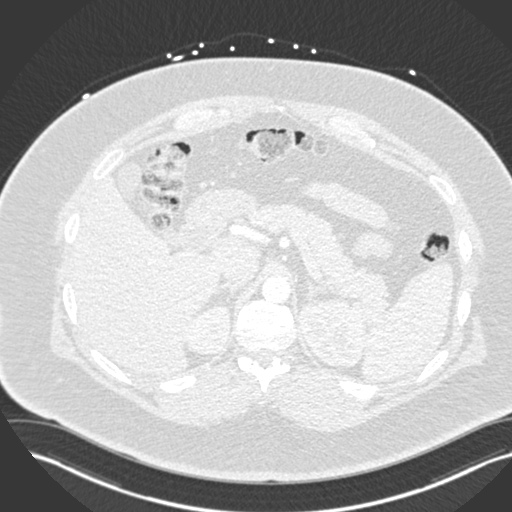
[im 40/394  mediastinal]
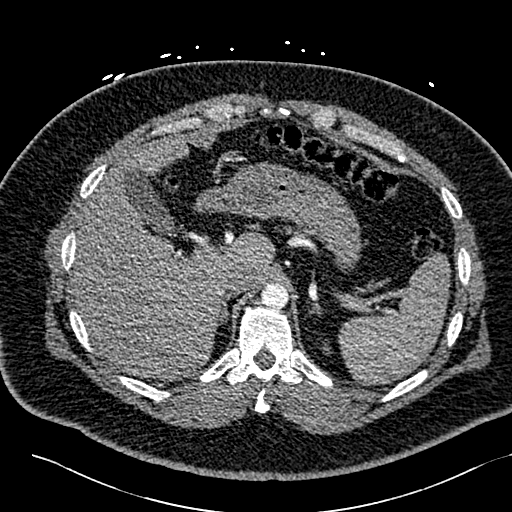
[im 59/394  lung]
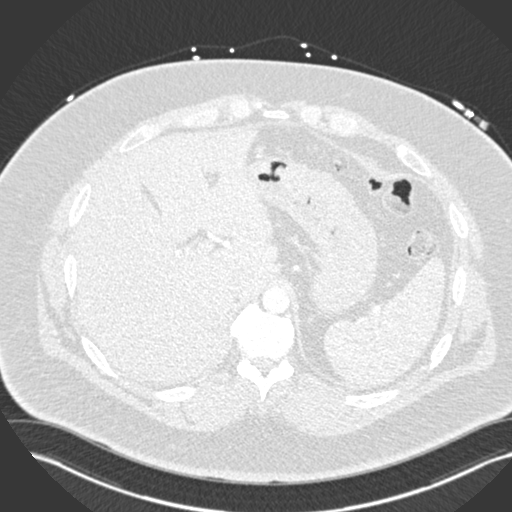
[im 79/394  mediastinal]
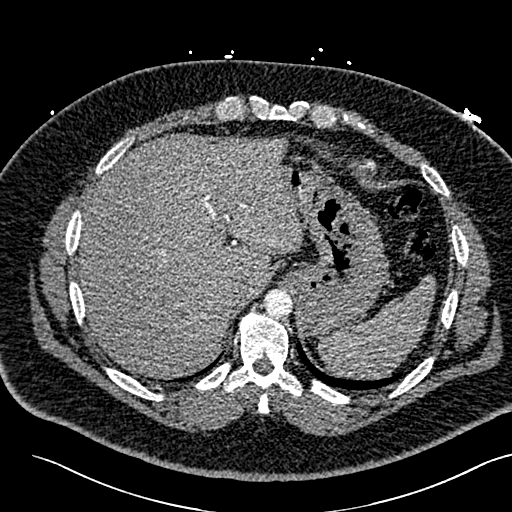
[im 118/394  lung]
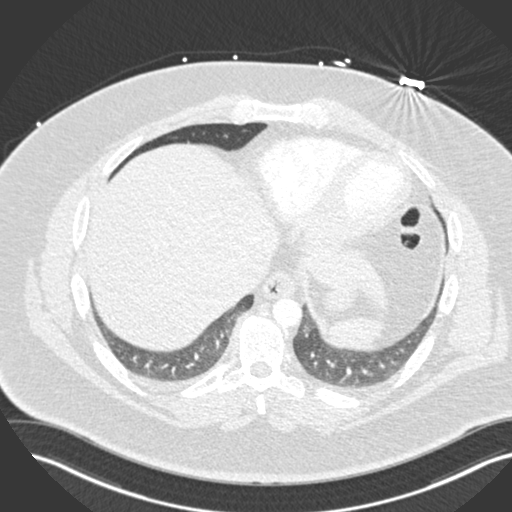
[im 138/394  mediastinal]
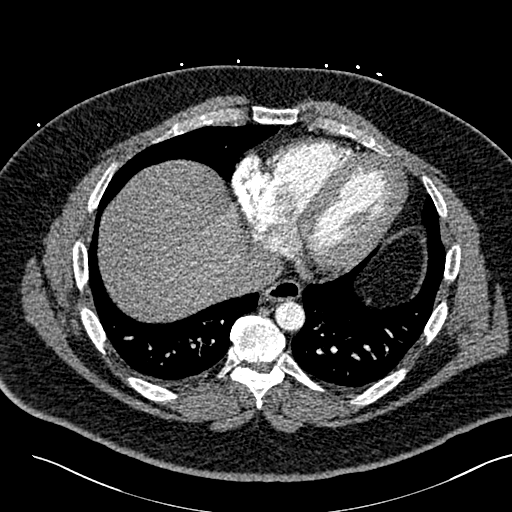
[im 158/394  lung]
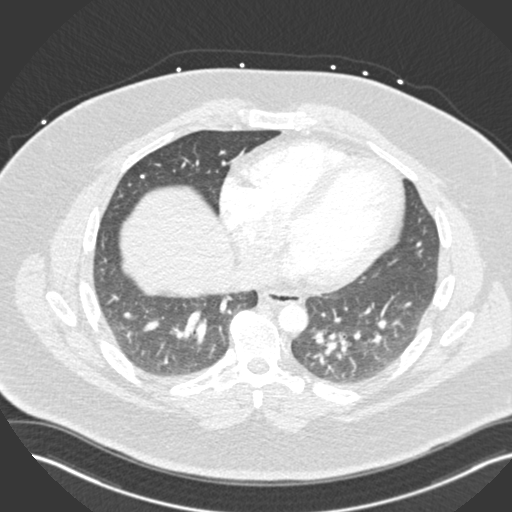
[im 177/394  mediastinal]
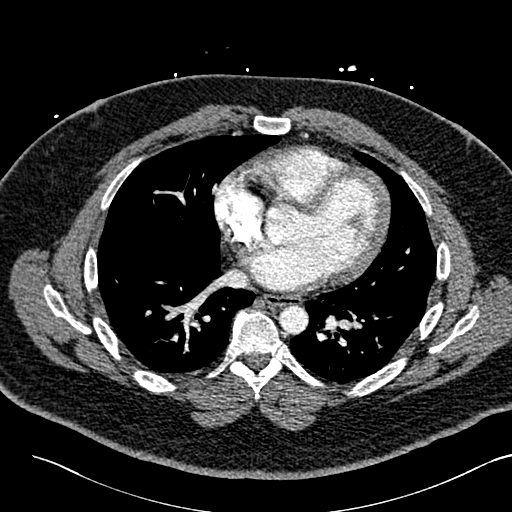
[im 197/394  lung]
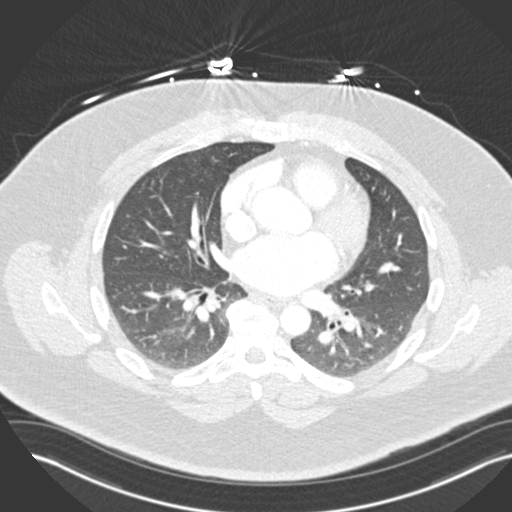
[im 217/394  mediastinal]
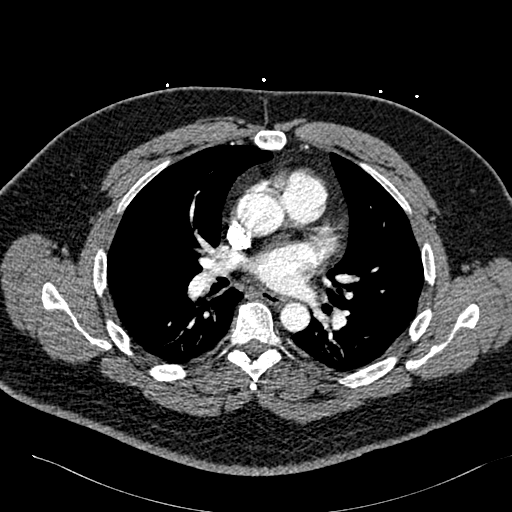
[im 236/394  lung]
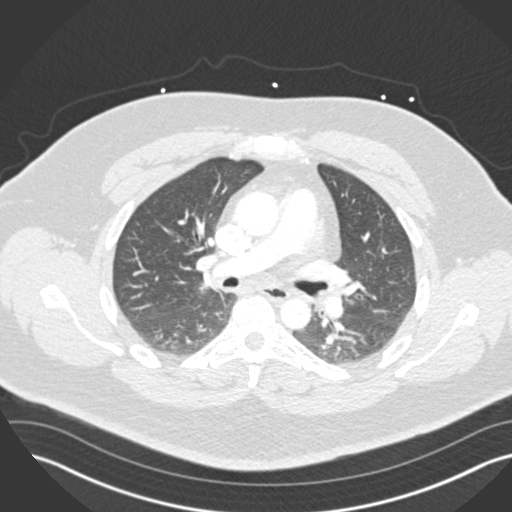
[im 256/394  mediastinal]
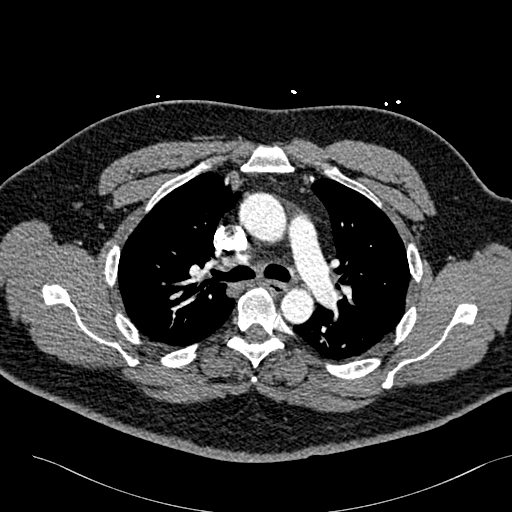
[im 276/394  lung]
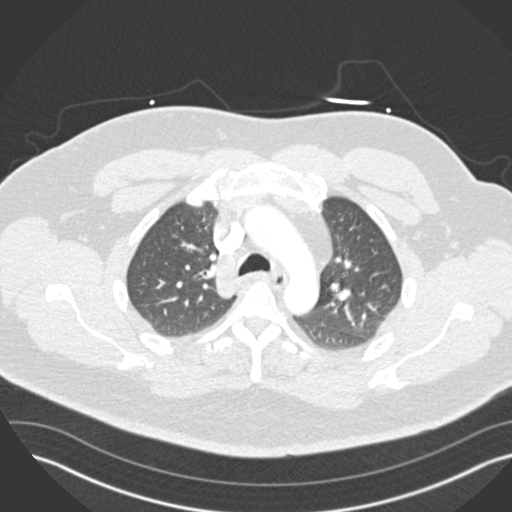
[im 315/394  mediastinal]
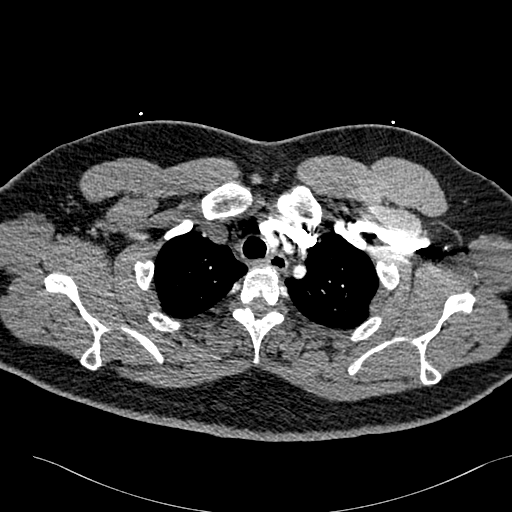
[im 335/394  lung]
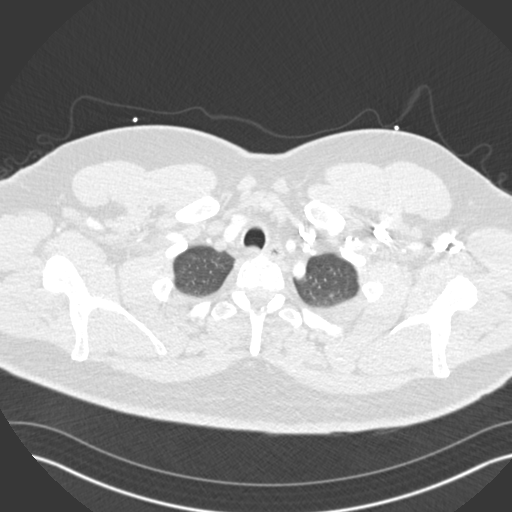
[im 354/394  mediastinal]
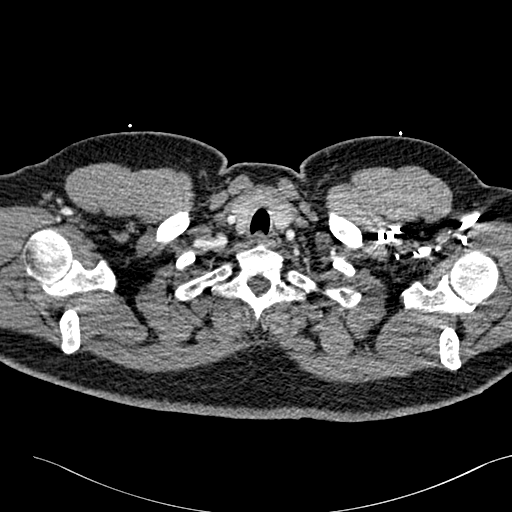
[im 374/394  lung]
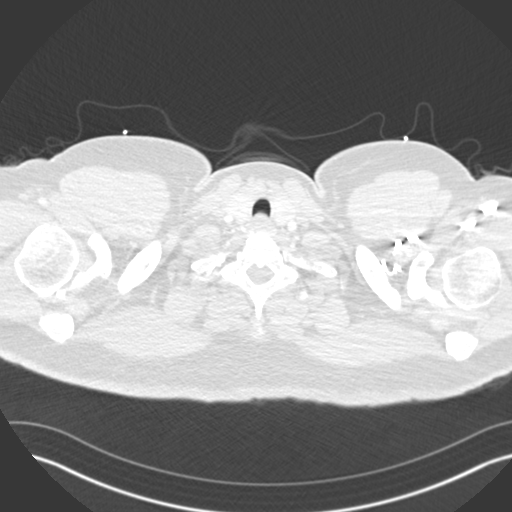

[18 of 36 positions shown; findings below may reference images not displayed]

FINDINGS: Cardiovascular: Satisfactory opacification of the pulmonary arteries
to the segmental level. No evidence of pulmonary embolism. Normal
heart size. No pericardial effusion.

Mediastinum/Nodes: No enlarged mediastinal, hilar, or axillary lymph
nodes. Thyroid gland, trachea, and esophagus demonstrate no
significant findings.

Lungs/Pleura: Central airways are normal. No pneumothorax. There is
a 4 mm nodule in the right middle lobe on series 5, image 51. No
other nodules. No masses or infiltrates.

Upper Abdomen: No acute abnormality.

Musculoskeletal: No chest wall abnormality. No acute or significant
osseous findings.

Review of the MIP images confirms the above findings.
IMPRESSION: 1. No pulmonary emboli. No cause for the patient's symptoms
identified.
2. 4 mm nodule in the right middle lobe. No follow-up needed if
patient is low-risk. Non-contrast chest CT can be considered in 12
months if patient is high-risk. This recommendation follows the
consensus statement: Guidelines for Management of Incidental
Pulmonary Nodules Detected on CT Images: From the [HOSPITAL]

## 2022-11-26 IMAGING — DX DG CHEST 1V PORT
1 series · 1 of 1 positions shown · non-contrast
Comparison: None.

CLINICAL DATA: Cough.  Tested positive for [id number] on 02/26/2021.

EXAM:
PORTABLE CHEST 1 VIEW

[chest ap]
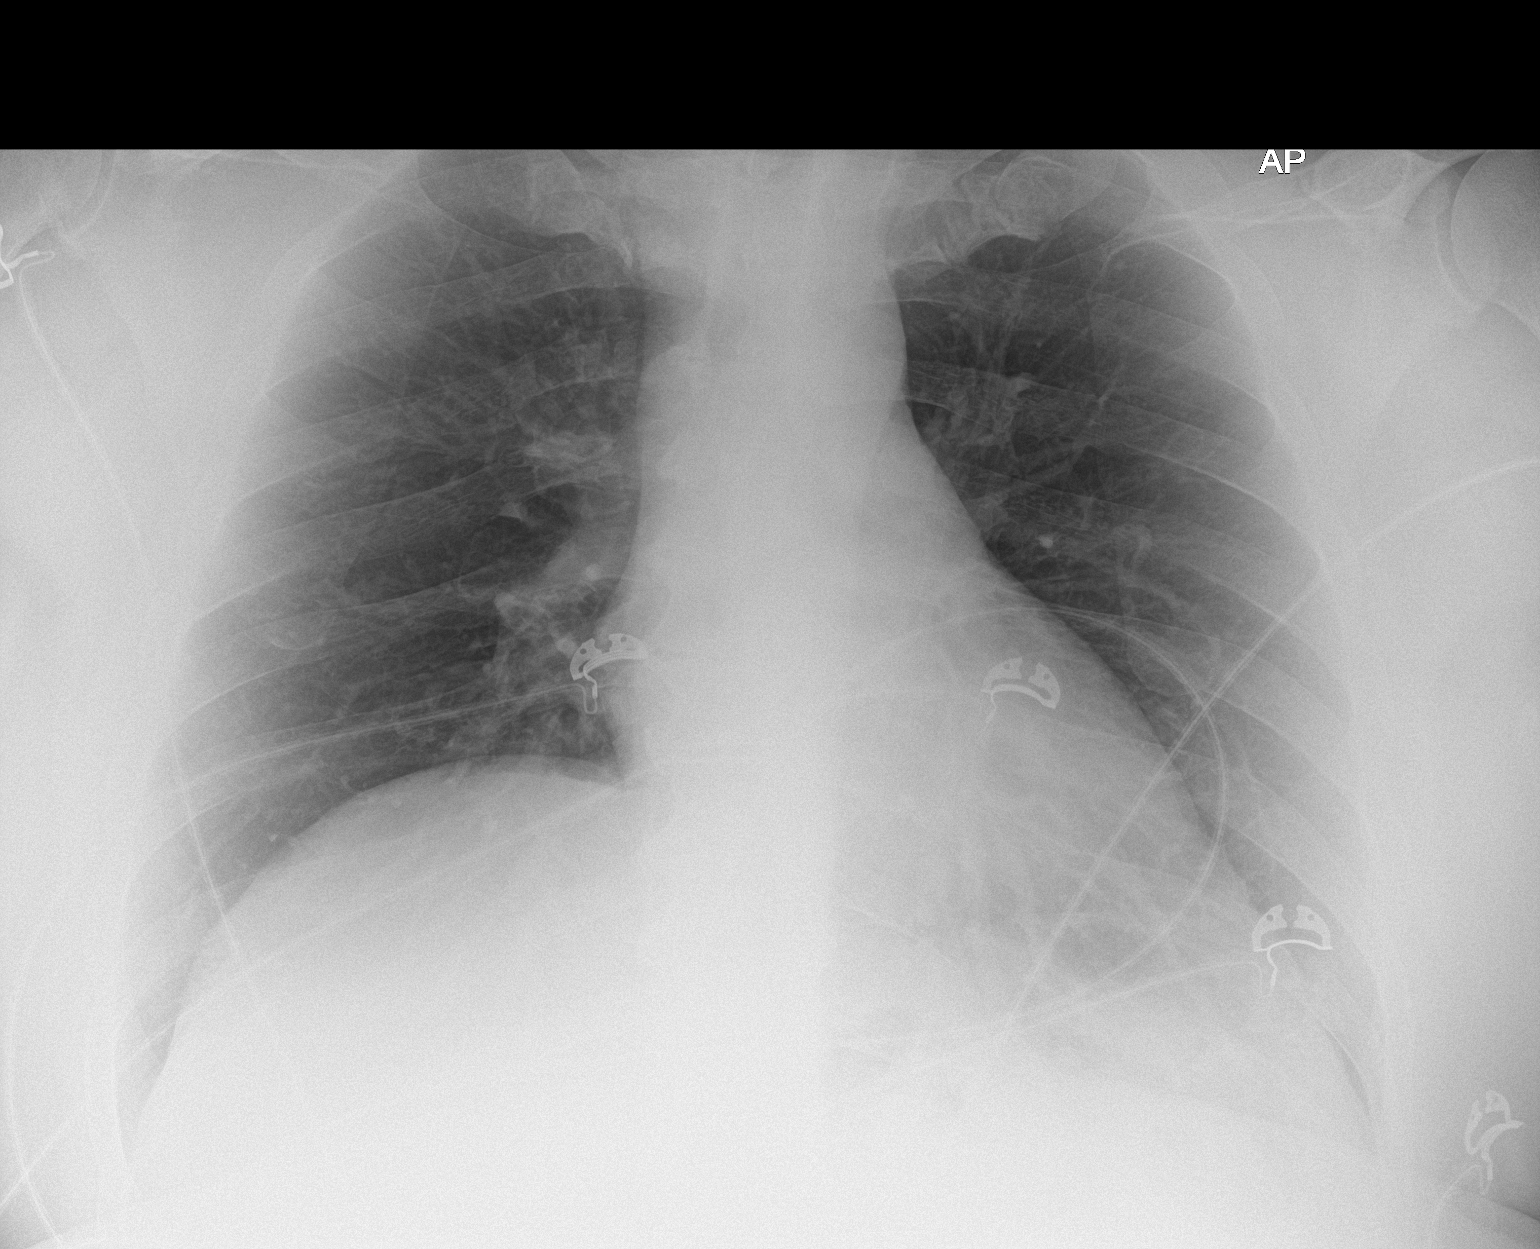

[1 of 1 positions shown; findings below may reference images not displayed]

FINDINGS: Cardiac silhouette is normal in size. Normal mediastinal and hilar
contours.

Clear lungs.  No pleural effusion or pneumothorax.

Skeletal structures are grossly intact.
IMPRESSION: No active disease.

## 2022-11-27 ENCOUNTER — Other Ambulatory Visit (HOSPITAL_COMMUNITY): Payer: Self-pay

## 2022-11-27 ENCOUNTER — Other Ambulatory Visit: Payer: Self-pay

## 2022-11-27 MED ORDER — MELOXICAM 15 MG PO TABS
15.0000 mg | ORAL_TABLET | Freq: Every day | ORAL | 0 refills | Status: DC
  Filled 2022-11-27 – 2023-01-09 (×2): qty 90, 90d supply, fill #0

## 2022-11-27 MED ORDER — ZEPBOUND 15 MG/0.5ML ~~LOC~~ SOAJ
15.0000 mg | SUBCUTANEOUS | 0 refills | Status: DC
  Filled 2022-11-27: qty 2, 28d supply, fill #0

## 2022-11-28 DIAGNOSIS — Z131 Encounter for screening for diabetes mellitus: Secondary | ICD-10-CM | POA: Diagnosis not present

## 2022-11-28 DIAGNOSIS — K219 Gastro-esophageal reflux disease without esophagitis: Secondary | ICD-10-CM | POA: Diagnosis not present

## 2022-11-28 DIAGNOSIS — R7309 Other abnormal glucose: Secondary | ICD-10-CM | POA: Diagnosis not present

## 2022-11-28 DIAGNOSIS — Z0000 Encounter for general adult medical examination without abnormal findings: Secondary | ICD-10-CM | POA: Diagnosis not present

## 2022-11-30 ENCOUNTER — Other Ambulatory Visit (HOSPITAL_COMMUNITY): Payer: Self-pay

## 2022-11-30 DIAGNOSIS — E291 Testicular hypofunction: Secondary | ICD-10-CM | POA: Diagnosis not present

## 2022-11-30 DIAGNOSIS — E7800 Pure hypercholesterolemia, unspecified: Secondary | ICD-10-CM | POA: Diagnosis not present

## 2022-11-30 DIAGNOSIS — E559 Vitamin D deficiency, unspecified: Secondary | ICD-10-CM | POA: Diagnosis not present

## 2022-11-30 DIAGNOSIS — Z131 Encounter for screening for diabetes mellitus: Secondary | ICD-10-CM | POA: Diagnosis not present
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
## 2022-12-07 ENCOUNTER — Ambulatory Visit (INDEPENDENT_AMBULATORY_CARE_PROVIDER_SITE_OTHER): Payer: BC Managed Care – PPO | Admitting: Family Medicine

## 2022-12-07 VITALS — BP 174/100 | HR 98 | Ht 74.0 in

## 2022-12-07 DIAGNOSIS — M25511 Pain in right shoulder: Secondary | ICD-10-CM | POA: Diagnosis not present

## 2022-12-07 DIAGNOSIS — G8929 Other chronic pain: Secondary | ICD-10-CM

## 2022-12-07 DIAGNOSIS — G540 Brachial plexus disorders: Secondary | ICD-10-CM

## 2022-12-07 NOTE — Patient Instructions (Addendum)
Thank you for coming in today.   I've referred you to neurology for a nerve conduction study. If not don't hear about scheduling within a week, please let me know  Check back after nerve conduction study.

## 2022-12-10 ENCOUNTER — Other Ambulatory Visit (HOSPITAL_COMMUNITY): Payer: Self-pay

## 2022-12-11 ENCOUNTER — Other Ambulatory Visit: Payer: Self-pay

## 2022-12-11 ENCOUNTER — Other Ambulatory Visit (HOSPITAL_COMMUNITY): Payer: Self-pay

## 2022-12-11 ENCOUNTER — Encounter: Payer: Self-pay | Admitting: Neurology

## 2022-12-11 MED ORDER — PHENTERMINE HCL 37.5 MG PO TABS
18.7500 mg | ORAL_TABLET | Freq: Every day | ORAL | 0 refills | Status: DC
  Filled 2022-12-11: qty 90, 90d supply, fill #0

## 2022-12-15 DIAGNOSIS — G540 Brachial plexus disorders: Secondary | ICD-10-CM | POA: Diagnosis not present

## 2022-12-15 DIAGNOSIS — S42002K Fracture of unspecified part of left clavicle, subsequent encounter for fracture with nonunion: Secondary | ICD-10-CM | POA: Diagnosis not present

## 2022-12-15 DIAGNOSIS — G5602 Carpal tunnel syndrome, left upper limb: Secondary | ICD-10-CM | POA: Diagnosis not present

## 2023-01-09 ENCOUNTER — Other Ambulatory Visit (HOSPITAL_COMMUNITY): Payer: Self-pay

## 2023-01-09 ENCOUNTER — Other Ambulatory Visit: Payer: Self-pay

## 2023-01-10 ENCOUNTER — Encounter: Payer: Self-pay | Admitting: Family Medicine

## 2023-01-10 DIAGNOSIS — Z4789 Encounter for other orthopedic aftercare: Secondary | ICD-10-CM | POA: Diagnosis not present

## 2023-01-10 DIAGNOSIS — S42002K Fracture of unspecified part of left clavicle, subsequent encounter for fracture with nonunion: Secondary | ICD-10-CM | POA: Diagnosis not present

## 2023-01-12 ENCOUNTER — Other Ambulatory Visit (HOSPITAL_COMMUNITY): Payer: Self-pay

## 2023-01-15 ENCOUNTER — Other Ambulatory Visit (HOSPITAL_COMMUNITY): Payer: Self-pay

## 2023-01-16 ENCOUNTER — Other Ambulatory Visit (HOSPITAL_COMMUNITY): Payer: Self-pay

## 2023-01-16 MED ORDER — PANTOPRAZOLE SODIUM 40 MG PO TBEC
40.0000 mg | DELAYED_RELEASE_TABLET | Freq: Every day | ORAL | 3 refills | Status: DC | PRN
  Filled 2023-01-16: qty 30, 30d supply, fill #0
  Filled 2023-02-07: qty 30, 30d supply, fill #1
  Filled 2023-03-06: qty 30, 30d supply, fill #2
  Filled 2023-04-15: qty 30, 30d supply, fill #3

## 2023-01-18 ENCOUNTER — Ambulatory Visit (INDEPENDENT_AMBULATORY_CARE_PROVIDER_SITE_OTHER): Payer: BC Managed Care – PPO | Admitting: Neurology

## 2023-01-18 ENCOUNTER — Other Ambulatory Visit (HOSPITAL_COMMUNITY): Payer: Self-pay

## 2023-01-18 DIAGNOSIS — M25511 Pain in right shoulder: Secondary | ICD-10-CM | POA: Diagnosis not present

## 2023-01-18 DIAGNOSIS — G5612 Other lesions of median nerve, left upper limb: Secondary | ICD-10-CM

## 2023-01-18 DIAGNOSIS — G540 Brachial plexus disorders: Secondary | ICD-10-CM

## 2023-01-18 DIAGNOSIS — G8929 Other chronic pain: Secondary | ICD-10-CM

## 2023-01-18 NOTE — Procedures (Signed)
[facility]  [address] [place], [address]  [place], [place] [address] Tel: [phone] Fax: [phone] Test Date:  01/18/2023  Patient: Andre Perez DOB: 07-Mar-1986 Physician: Nita Sickle, DO  Sex: Male Height: 6\' 2"  Ref Phys: Rodolph Bong, MD  ID#: [id number]   Technician:    History: This is a 37 year old male referred for evaluation of right hand paresthesias following clavicle surgery.  NCV & EMG Findings: Extensive electrodiagnostic testing of the left upper extremity shows:  Left median sensory response shows reduced amplitude (L8.1 V).  Left ulnar, radial, mixed palmar, medial antebrachial, and lateral antebrachial cutaneous sensory responses are within normal limits.  Left median and ulnar motor responses are within normal limits.  Chronic motor axonal loss changes are seen affecting the left pronator teres, flexor pollicis longus, and abductor pollicis brevis muscle, without accompanied active denervation.  Impression: The electrophysiologic findings are consistent with a chronic left median neuropathy proximal to the branch point to the pronator teres muscle.  Overall, these findings are mild-to-moderate in degree electrically.   ___________________________ Nita Sickle, DO    Nerve Conduction Studies   Stim Site NR Peak (ms) Norm Peak (ms) O-P Amp (V) Norm O-P Amp  Left Lat Ante Brach Cutan Anti Sensory (Lat Forearm)  32 C  Lat Biceps    2.0 <2.9 11.5   Left Med Ante Brach Cutan Anti Sensory (Med Forearm)  32 C  Elbow    2.5  11.6   Left Median Anti Sensory (2nd Digit)  32 C  Wrist    3.2 <3.4 *8.1 >20  Left Radial Anti Sensory (Base 1st Digit)  32 C  Wrist    1.7 <2.7 33.3 >18  Left Ulnar Anti Sensory (5th Digit)  32 C  Wrist    2.5 <3.1 26.7 >12     Stim Site NR Onset (ms) Norm Onset (ms) O-P Amp (mV) Norm O-P Amp Site1 Site2 Delta-0 (ms) Dist (cm) Vel (m/s) Norm Vel (m/s)  Left Median Motor (Abd Poll Brev)  32 C  Wrist    2.8 <3.9  10.6 >6 Elbow Wrist 5.6 32.0 57 >50  Elbow    8.4  10.3         Left Ulnar Motor (Abd Dig Minimi)  32 C  Wrist    2.3 <3.1 8.0 >7 B Elbow Wrist 3.9 23.0 59 >50  B Elbow    6.2  7.0  A Elbow B Elbow 1.6 10.0 63 >50  A Elbow    7.8  6.9            Stim Site NR Peak (ms) Norm Peak (ms) P-T Amp (V) Site1 Site2 Delta-P (ms) Norm Delta (ms)  Left Median/Ulnar Palm Comparison (Wrist - 8cm)  32 C  Median Palm    1.7 <2.2 20.2 Median Palm Ulnar Palm 0.2   Ulnar Palm    1.5 <2.2 12.6       Electromyography   Side Muscle Ins.Act Fibs Fasc Recrt Amp Dur Poly Activation Comment  Left 1stDorInt Nml Nml Nml Nml Nml Nml Nml Nml N/A  Left Abd Poll Brev Nml Nml Nml *1- *1+ *1+ *1+ Nml N/A  Left FlexPolLong Nml Nml Nml *1- *1+ *1+ *1+ Nml N/A  Left PronatorTeres Nml Nml Nml *1- *1+ *1+ *1+ Nml N/A  Left Biceps Nml Nml Nml Nml Nml Nml Nml Nml N/A  Left Triceps Nml Nml Nml Nml Nml Nml Nml Nml N/A  Left Deltoid Nml Nml Nml Nml Nml  Nml Nml Nml N/A      Waveforms:

## 2023-01-19 NOTE — Progress Notes (Signed)
Nerve conduction study does show a problem with the median nerve being pinched at around the elbow level or perhaps a little further up.  This could potentially be treated with injection or therapy.  Recommend returning to clinic to talk about the results in full detail.

## 2023-01-19 NOTE — Progress Notes (Unsigned)
Rubin Payor, PhD, LAT, ATC acting as a scribe for [name], MD.  Andre Perez is a 37 y.o. male who presents to [facility] at [facility] today for f/u R shoulder pain/thoracic outlet syndrome and NCV study review. Pt was last seen by Dr. Denyse Amass on 12/07/22 and a NCV study was ordered.  Today, pt reports ***  Dx testing: 01/18/23 NCV study 06/18/21 R shoulder MRI  01/31/21 R shoulder XR  Pertinent review of systems: ***  Relevant historical information: ***   Exam:  There were no vitals taken for this visit. General: Well Developed, well nourished, and in no acute distress.   MSK: ***    Lab and Radiology Results No results found for this or any previous visit (from the past 72 hour(s)). NCV with EMG(electromyography)  Result Date: 01/18/2023 Glendale Chard, DO     01/18/2023 11:56 AM [facility] [address] [place], [address]  [place], [place] [address] Tel: [phone] Fax: [phone] Test Date:  01/18/2023 Patient: Andre Perez DOB: Nov 12, 1985 Physician: Nita Sickle, DO Sex: Male Height: 6\' 2"  Ref Phys: Rodolph Bong, MD ID#: [id number]   Technician:  History: This is a 37 year old male referred for evaluation of right hand paresthesias following clavicle surgery. NCV & EMG Findings: Extensive electrodiagnostic testing of the left upper extremity shows: Left median sensory response shows reduced amplitude (L8.1 V).  Left ulnar, radial, mixed palmar, medial antebrachial, and lateral antebrachial cutaneous sensory responses are within normal limits. Left median and ulnar motor responses are within normal limits. Chronic motor axonal loss changes are seen affecting the left pronator teres, flexor pollicis longus, and abductor pollicis brevis muscle, without accompanied active denervation. Impression: The electrophysiologic findings are consistent with a chronic left median neuropathy proximal to the branch point to the pronator teres  muscle.  Overall, these findings are mild-to-moderate in degree electrically. ___________________________ Nita Sickle, DO Nerve Conduction Studies  Stim Site NR Peak (ms) Norm Peak (ms) O-P Amp (V) Norm O-P Amp Left Lat Ante Brach Cutan Anti Sensory (Lat Forearm)  32 C Lat Biceps    2.0 <2.9 11.5  Left Med Ante Brach Cutan Anti Sensory (Med Forearm)  32 C Elbow    2.5  11.6  Left Median Anti Sensory (2nd Digit)  32 C Wrist    3.2 <3.4 *8.1 >20 Left Radial Anti Sensory (Base 1st Digit)  32 C Wrist    1.7 <2.7 33.3 >18 Left Ulnar Anti Sensory (5th Digit)  32 C Wrist    2.5 <3.1 26.7 >12  Stim Site NR Onset (ms) Norm Onset (ms) O-P Amp (mV) Norm O-P Amp Site1 Site2 Delta-0 (ms) Dist (cm) Vel (m/s) Norm Vel (m/s) Left Median Motor (Abd Poll Brev)  32 C Wrist    2.8 <3.9 10.6 >6 Elbow Wrist 5.6 32.0 57 >50 Elbow    8.4  10.3        Left Ulnar Motor (Abd Dig Minimi)  32 C Wrist    2.3 <3.1 8.0 >7 B Elbow Wrist 3.9 23.0 59 >50 B Elbow    6.2  7.0  A Elbow B Elbow 1.6 10.0 63 >50 A Elbow    7.8  6.9         Stim Site NR Peak (ms) Norm Peak (ms) P-T Amp (V) Site1 Site2 Delta-P (ms) Norm Delta (ms) Left Median/Ulnar Palm Comparison (Wrist - 8cm)  32 C Median Palm    1.7 <2.2 20.2 Median Palm Ulnar  Palm 0.2  Ulnar Palm    1.5 <2.2 12.6     Electromyography  Side Muscle Ins.Act Fibs Fasc Recrt Amp Dur Poly Activation Comment Left 1stDorInt Nml Nml Nml Nml Nml Nml Nml Nml N/A Left Abd Poll Brev Nml Nml Nml *1- *1+ *1+ *1+ Nml N/A Left FlexPolLong Nml Nml Nml *1- *1+ *1+ *1+ Nml N/A Left PronatorTeres Nml Nml Nml *1- *1+ *1+ *1+ Nml N/A Left Biceps Nml Nml Nml Nml Nml Nml Nml Nml N/A Left Triceps Nml Nml Nml Nml Nml Nml Nml Nml N/A Left Deltoid Nml Nml Nml Nml Nml Nml Nml Nml N/A Waveforms:                    Assessment and Plan: 37 y.o. male with ***   PDMP not reviewed this encounter. No orders of the defined types were placed in this encounter.  No orders of the defined types were placed in this  encounter.    Discussed warning signs or symptoms. Please see discharge instructions. Patient expresses understanding.   ***

## 2023-01-22 ENCOUNTER — Ambulatory Visit (INDEPENDENT_AMBULATORY_CARE_PROVIDER_SITE_OTHER): Payer: BC Managed Care – PPO

## 2023-01-22 ENCOUNTER — Encounter: Payer: Self-pay | Admitting: Family Medicine

## 2023-01-22 ENCOUNTER — Other Ambulatory Visit (HOSPITAL_COMMUNITY): Payer: Self-pay

## 2023-01-22 ENCOUNTER — Other Ambulatory Visit: Payer: Self-pay

## 2023-01-22 ENCOUNTER — Ambulatory Visit (INDEPENDENT_AMBULATORY_CARE_PROVIDER_SITE_OTHER): Payer: BC Managed Care – PPO | Admitting: Family Medicine

## 2023-01-22 VITALS — BP 104/80 | HR 89 | Ht 74.0 in | Wt 250.0 lb

## 2023-01-22 DIAGNOSIS — M47812 Spondylosis without myelopathy or radiculopathy, cervical region: Secondary | ICD-10-CM | POA: Diagnosis not present

## 2023-01-22 DIAGNOSIS — M79602 Pain in left arm: Secondary | ICD-10-CM | POA: Diagnosis not present

## 2023-01-22 DIAGNOSIS — M5412 Radiculopathy, cervical region: Secondary | ICD-10-CM

## 2023-01-22 DIAGNOSIS — M542 Cervicalgia: Secondary | ICD-10-CM | POA: Diagnosis not present

## 2023-01-22 MED ORDER — ZEPBOUND 15 MG/0.5ML ~~LOC~~ SOAJ
SUBCUTANEOUS | 0 refills | Status: DC
  Filled 2023-01-22 – 2023-03-06 (×2): qty 2, 28d supply, fill #0

## 2023-01-22 NOTE — Patient Instructions (Signed)
Thank you for coming in today.   Please get an Xray today before you leave \  You should hear from MRI scheduling within 1 week. If you do not hear please let me know.   

## 2023-01-23 ENCOUNTER — Other Ambulatory Visit (HOSPITAL_COMMUNITY): Payer: Self-pay

## 2023-01-23 NOTE — Progress Notes (Signed)
Cervical spine x-ray shows some mild arthritis.

## 2023-01-24 ENCOUNTER — Other Ambulatory Visit: Payer: Self-pay

## 2023-02-05 ENCOUNTER — Ambulatory Visit
Admission: RE | Admit: 2023-02-05 | Discharge: 2023-02-05 | Disposition: A | Discharge status: Home / Self Care | Payer: BC Managed Care – PPO | Source: Ambulatory Visit | Attending: Family Medicine | Admitting: Family Medicine

## 2023-02-05 DIAGNOSIS — M5412 Radiculopathy, cervical region: Secondary | ICD-10-CM

## 2023-02-05 DIAGNOSIS — M50221 Other cervical disc displacement at C4-C5 level: Secondary | ICD-10-CM | POA: Diagnosis not present

## 2023-02-05 DIAGNOSIS — M50223 Other cervical disc displacement at C6-C7 level: Secondary | ICD-10-CM | POA: Diagnosis not present

## 2023-02-05 DIAGNOSIS — M50222 Other cervical disc displacement at C5-C6 level: Secondary | ICD-10-CM | POA: Diagnosis not present

## 2023-02-05 DIAGNOSIS — M5021 Other cervical disc displacement,  high cervical region: Secondary | ICD-10-CM | POA: Diagnosis not present

## 2023-02-07 ENCOUNTER — Other Ambulatory Visit (HOSPITAL_COMMUNITY): Payer: Self-pay

## 2023-02-08 ENCOUNTER — Other Ambulatory Visit: Payer: Self-pay

## 2023-02-08 ENCOUNTER — Other Ambulatory Visit (HOSPITAL_COMMUNITY): Payer: Self-pay

## 2023-02-08 MED ORDER — CLOMID 50 MG PO TABS
50.0000 mg | ORAL_TABLET | ORAL | 0 refills | Status: DC
  Filled 2023-02-08: qty 12, 84d supply, fill #0
  Filled 2023-07-17: qty 12, 84d supply, fill #1

## 2023-02-12 DIAGNOSIS — E291 Testicular hypofunction: Secondary | ICD-10-CM | POA: Diagnosis not present

## 2023-02-12 DIAGNOSIS — M5412 Radiculopathy, cervical region: Secondary | ICD-10-CM | POA: Diagnosis not present

## 2023-02-12 DIAGNOSIS — M898X1 Other specified disorders of bone, shoulder: Secondary | ICD-10-CM | POA: Diagnosis not present

## 2023-02-12 DIAGNOSIS — R632 Polyphagia: Secondary | ICD-10-CM | POA: Diagnosis not present

## 2023-02-12 DIAGNOSIS — E66811 Obesity, class 1: Secondary | ICD-10-CM | POA: Diagnosis not present

## 2023-02-19 ENCOUNTER — Other Ambulatory Visit: Payer: Self-pay

## 2023-02-19 ENCOUNTER — Encounter: Payer: Self-pay | Admitting: Family Medicine

## 2023-02-19 DIAGNOSIS — M5412 Radiculopathy, cervical region: Secondary | ICD-10-CM

## 2023-02-19 NOTE — Progress Notes (Signed)
Cervical spine MRI shows areas where the nerves could be pinched causing pain to go down the left arm. I could arrange for an injection in your neck which I think would be helpful.  Would you like me to asked the radiologist to do an injection in your neck to help reduce that pinched nerve sensation.  I think that could help.

## 2023-03-02 NOTE — Discharge Instructions (Signed)

## 2023-03-05 ENCOUNTER — Ambulatory Visit
Admission: RE | Admit: 2023-03-05 | Discharge: 2023-03-05 | Disposition: A | Discharge status: Home / Self Care | Payer: BC Managed Care – PPO | Source: Ambulatory Visit | Attending: Family Medicine | Admitting: Family Medicine

## 2023-03-05 DIAGNOSIS — M5412 Radiculopathy, cervical region: Secondary | ICD-10-CM | POA: Diagnosis not present

## 2023-03-05 DIAGNOSIS — L814 Other melanin hyperpigmentation: Secondary | ICD-10-CM | POA: Diagnosis not present

## 2023-03-05 DIAGNOSIS — D225 Melanocytic nevi of trunk: Secondary | ICD-10-CM | POA: Diagnosis not present

## 2023-03-05 MED ORDER — IOPAMIDOL (ISOVUE-M 300) INJECTION 61%
1.0000 mL | Freq: Once | INTRAMUSCULAR | Status: AC | PRN
  Administered 2023-03-05: 1 mL via EPIDURAL

## 2023-03-05 MED ORDER — TRIAMCINOLONE ACETONIDE 40 MG/ML IJ SUSP (RADIOLOGY)
60.0000 mg | Freq: Once | INTRAMUSCULAR | Status: AC
  Administered 2023-03-05: 60 mg via EPIDURAL

## 2023-03-06 ENCOUNTER — Other Ambulatory Visit (HOSPITAL_COMMUNITY): Payer: Self-pay

## 2023-03-07 ENCOUNTER — Other Ambulatory Visit (HOSPITAL_COMMUNITY): Payer: Self-pay

## 2023-03-12 ENCOUNTER — Other Ambulatory Visit (HOSPITAL_COMMUNITY): Payer: Self-pay

## 2023-03-17 ENCOUNTER — Telehealth: Payer: BC Managed Care – PPO | Admitting: Family Medicine

## 2023-03-17 DIAGNOSIS — Z20818 Contact with and (suspected) exposure to other bacterial communicable diseases: Secondary | ICD-10-CM | POA: Diagnosis not present

## 2023-03-17 DIAGNOSIS — J020 Streptococcal pharyngitis: Secondary | ICD-10-CM | POA: Diagnosis not present

## 2023-03-17 MED ORDER — AZITHROMYCIN 250 MG PO TABS
ORAL_TABLET | ORAL | 0 refills | Status: AC
Start: 2023-03-17 — End: 2023-03-22

## 2023-03-17 NOTE — Progress Notes (Signed)
E-Visit for Sore Throat - Strep Symptoms  We are sorry that you are not feeling well.  Here is how we plan to help!  Based on what you have shared with me it is likely that you have strep pharyngitis.  Strep pharyngitis is inflammation and infection in the back of the throat.  This is an infection cause by bacteria and is treated with antibiotics.  I have prescribed Azithromycin 250 mg two tablets today and then one daily for 4 additional days. For throat pain, we recommend over the counter oral pain relief medications such as acetaminophen or aspirin, or anti-inflammatory medications such as ibuprofen or naproxen sodium. Topical treatments such as oral throat lozenges or sprays may be used as needed. Strep infections are not as easily transmitted as other respiratory infections, however we still recommend that you avoid close contact with loved ones, especially the very young and elderly.  Remember to wash your hands thoroughly throughout the day as this is the number one way to prevent the spread of infection and wipe down door knobs and counters with disinfectant.   Home Care: Only take medications as instructed by your medical team. Complete the entire course of an antibiotic. Do not take these medications with alcohol. A steam or ultrasonic humidifier can help congestion.  You can place a towel over your head and breathe in the steam from hot water coming from a faucet. Avoid close contacts especially the very young and the elderly. Cover your mouth when you cough or sneeze. Always remember to wash your hands.  Get Help Right Away If: You develop worsening fever or sinus pain. You develop a severe head ache or visual changes. Your symptoms persist after you have completed your treatment plan.  Make sure you Understand these instructions. Will watch your condition. Will get help right away if you are not doing well or get worse.   Thank you for choosing an e-visit.  Your e-visit  answers were reviewed by a board certified advanced clinical practitioner to complete your personal care plan. Depending upon the condition, your plan could have included both over the counter or prescription medications.  Please review your pharmacy choice. Make sure the pharmacy is open so you can pick up prescription now. If there is a problem, you may contact your provider through [employer] and have the prescription routed to another pharmacy.  Your safety is important to [place]. If you have drug allergies check your prescription carefully.   For the next 24 hours you can use MyChart to ask questions about today's visit, request a non-urgent call back, or ask for a work or school excuse. You will get an email in the next two days asking about your experience. I hope that your e-visit has been valuable and will speed your recovery.    have provided 5 minutes of non face to face time during this encounter for chart review and documentation.

## 2023-03-18 ENCOUNTER — Telehealth: Payer: BC Managed Care – PPO

## 2023-04-07 ENCOUNTER — Other Ambulatory Visit (HOSPITAL_COMMUNITY): Payer: Self-pay

## 2023-04-07 MED ORDER — ZEPBOUND 15 MG/0.5ML ~~LOC~~ SOAJ
15.0000 mg | SUBCUTANEOUS | 0 refills | Status: DC
  Filled 2023-04-07 – 2023-05-06 (×2): qty 2, 28d supply, fill #0

## 2023-04-09 ENCOUNTER — Other Ambulatory Visit (HOSPITAL_COMMUNITY): Payer: Self-pay

## 2023-04-15 ENCOUNTER — Other Ambulatory Visit (HOSPITAL_COMMUNITY): Payer: Self-pay

## 2023-04-16 ENCOUNTER — Other Ambulatory Visit (HOSPITAL_COMMUNITY): Payer: Self-pay

## 2023-04-16 ENCOUNTER — Other Ambulatory Visit: Payer: Self-pay

## 2023-04-16 MED ORDER — TRAZODONE HCL 50 MG PO TABS
50.0000 mg | ORAL_TABLET | Freq: Every evening | ORAL | 0 refills | Status: DC
  Filled 2023-04-16 – 2023-09-10 (×6): qty 90, 90d supply, fill #0

## 2023-04-18 ENCOUNTER — Other Ambulatory Visit (HOSPITAL_COMMUNITY): Payer: Self-pay

## 2023-04-18 DIAGNOSIS — S42002K Fracture of unspecified part of left clavicle, subsequent encounter for fracture with nonunion: Secondary | ICD-10-CM | POA: Diagnosis not present

## 2023-04-22 ENCOUNTER — Other Ambulatory Visit (HOSPITAL_COMMUNITY): Payer: Self-pay

## 2023-04-23 ENCOUNTER — Other Ambulatory Visit (HOSPITAL_COMMUNITY): Payer: Self-pay

## 2023-04-23 MED ORDER — MELOXICAM 15 MG PO TABS
15.0000 mg | ORAL_TABLET | Freq: Every day | ORAL | 0 refills | Status: DC
  Filled 2023-04-23: qty 90, 90d supply, fill #0

## 2023-05-02 DIAGNOSIS — H9311 Tinnitus, right ear: Secondary | ICD-10-CM | POA: Diagnosis not present

## 2023-05-02 DIAGNOSIS — R42 Dizziness and giddiness: Secondary | ICD-10-CM | POA: Diagnosis not present

## 2023-05-03 ENCOUNTER — Emergency Department (HOSPITAL_BASED_OUTPATIENT_CLINIC_OR_DEPARTMENT_OTHER)
Admission: EM | Admit: 2023-05-03 | Discharge: 2023-05-03 | Disposition: A | Discharge status: Home / Self Care | Payer: BC Managed Care – PPO | Attending: Emergency Medicine | Admitting: Emergency Medicine

## 2023-05-03 ENCOUNTER — Other Ambulatory Visit: Payer: Self-pay

## 2023-05-03 ENCOUNTER — Emergency Department (HOSPITAL_BASED_OUTPATIENT_CLINIC_OR_DEPARTMENT_OTHER): Payer: BC Managed Care – PPO

## 2023-05-03 ENCOUNTER — Encounter (HOSPITAL_BASED_OUTPATIENT_CLINIC_OR_DEPARTMENT_OTHER): Payer: Self-pay | Admitting: Emergency Medicine

## 2023-05-03 ENCOUNTER — Emergency Department (HOSPITAL_BASED_OUTPATIENT_CLINIC_OR_DEPARTMENT_OTHER): Payer: BC Managed Care – PPO | Admitting: Radiology

## 2023-05-03 DIAGNOSIS — M25512 Pain in left shoulder: Secondary | ICD-10-CM | POA: Insufficient documentation

## 2023-05-03 DIAGNOSIS — R531 Weakness: Secondary | ICD-10-CM | POA: Diagnosis not present

## 2023-05-03 DIAGNOSIS — R519 Headache, unspecified: Secondary | ICD-10-CM | POA: Insufficient documentation

## 2023-05-03 DIAGNOSIS — R0789 Other chest pain: Secondary | ICD-10-CM | POA: Diagnosis not present

## 2023-05-03 DIAGNOSIS — F419 Anxiety disorder, unspecified: Secondary | ICD-10-CM | POA: Insufficient documentation

## 2023-05-03 DIAGNOSIS — R079 Chest pain, unspecified: Secondary | ICD-10-CM | POA: Diagnosis not present

## 2023-05-03 DIAGNOSIS — R42 Dizziness and giddiness: Secondary | ICD-10-CM | POA: Insufficient documentation

## 2023-05-03 DIAGNOSIS — M79602 Pain in left arm: Secondary | ICD-10-CM | POA: Diagnosis not present

## 2023-05-03 LAB — CBC
HCT: 46.4 % (ref 39.0–52.0)
Hemoglobin: 15.9 g/dL (ref 13.0–17.0)
MCH: 31.1 pg (ref 26.0–34.0)
MCHC: 34.3 g/dL (ref 30.0–36.0)
MCV: 90.8 fL (ref 80.0–100.0)
Platelets: 258 10*3/uL (ref 150–400)
RBC: 5.11 MIL/uL (ref 4.22–5.81)
RDW: 12.2 % (ref 11.5–15.5)
WBC: 7.9 10*3/uL (ref 4.0–10.5)
nRBC: 0 % (ref 0.0–0.2)

## 2023-05-03 LAB — BASIC METABOLIC PANEL
Anion gap: 10 (ref 5–15)
BUN: 14 mg/dL (ref 6–20)
CO2: 25 mmol/L (ref 22–32)
Calcium: 9.4 mg/dL (ref 8.9–10.3)
Chloride: 108 mmol/L (ref 98–111)
Creatinine, Ser: 0.93 mg/dL (ref 0.61–1.24)
GFR, Estimated: >60 mL/min (ref >60)
Glucose, Bld: 91 mg/dL (ref 70–99)
Potassium: 4 mmol/L (ref 3.5–5.1)
Sodium: 143 mmol/L (ref 135–145)

## 2023-05-03 LAB — TROPONIN I (HIGH SENSITIVITY)
Troponin I (High Sensitivity): 2 ng/L (ref <18)
Troponin I (High Sensitivity): 2 ng/L (ref <18)

## 2023-05-03 MED ORDER — ACETAMINOPHEN 500 MG PO TABS
1000.0000 mg | ORAL_TABLET | Freq: Once | ORAL | Status: AC
  Administered 2023-05-03: 1000 mg via ORAL
  Filled 2023-05-03: qty 2

## 2023-05-03 MED ORDER — SODIUM CHLORIDE 0.9 % IV BOLUS
1000.0000 mL | Freq: Once | INTRAVENOUS | Status: AC
  Administered 2023-05-03: 1000 mL via INTRAVENOUS

## 2023-05-03 NOTE — ED Triage Notes (Signed)
Chest pain/left arm pain (tingling) today, has been dizzy for few days and saw MD about that has referral for that.

## 2023-05-03 NOTE — ED Provider Notes (Signed)
 [place] EMERGENCY DEPARTMENT AT [facility] Provider Note   CSN: [id number] Arrival date & time: 05/03/23  1146     History  Chief Complaint  Patient presents with   Chest Pain    Andre Perez is a 38 y.o. male.   Chest Pain 38 year old male history of GERD, prior cervical radiculitis with residual left arm weakness and numbness presenting for left arm pain.  Patient states for last few days Andre Perez is felt somewhat dizzy and swimmy headed.  Andre Perez feels like his been somewhat off, Andre Perez has been taking gabapentin but is unsure.  Today Andre Perez woke up with left arm pain up near his humerus.  Andre Perez did not have any trauma.  No rash or skin changes.  Has some chronic numbness and weakness in his left hand, this is not acutely changed.  Andre Perez otherwise feels okay.  Andre Perez also has had some left-sided chest pain today.  Nonexertional, no pleuritic pain.  No history of DVT or PE or recent travel or immobilization.  Andre Perez has no leg swelling.     Home Medications Prior to Admission medications   Medication Sig Start Date End Date Taking? Authorizing Provider  clomiPHENE (CLOMID) 50 MG tablet Take 25 mg by mouth 2 (two) times a week. 11/15/20   [provider]  clomiPHENE (CLOMID) 50 MG tablet Take 1/2 tablet (25mg  total) by mouth twice weekly 10/03/21     clomiPHENE (CLOMID) 50 MG tablet Take 0.5 tablets (25 mg total) by mouth 2 (two) times a week. 03/02/22     clomiPHENE (CLOMID) 50 MG tablet Take 1/2 tablet (25 mg total) by mouth two times per week 02/08/23     meloxicam (MOBIC) 15 MG tablet Take 1 tablet (15 mg total) by mouth daily. 04/22/23     Multiple Vitamins-Minerals (MULTIVITAMIN & MINERAL PO) Take by mouth.    [provider]  Naproxen Sodium (ALEVE PO) Take by mouth as needed.    [provider]  pantoprazole (PROTONIX) 40 MG tablet Take 40 mg by mouth daily as needed. 07/18/19   [provider]  pantoprazole (PROTONIX) 40 MG tablet Take 1 tablet (40 mg  total) by mouth daily as needed. 03/23/22     pantoprazole (PROTONIX) 40 MG tablet Take 1 tablet (40 mg total) by mouth daily as needed. 01/16/23     phentermine (ADIPEX-P) 37.5 MG tablet Take 1/2-1 tablets (18.75-37.5 mg total) by mouth daily before breakfast. 06/23/21   Helane Rima, DO  phentermine (ADIPEX-P) 37.5 MG tablet Take 0.5-1 tablets (18.75-37.5 mg total) by mouth daily before breakfast. 12/11/22     tirzepatide (MOUNJARO) 10 MG/0.5ML Pen Inject 1 pen (0.5 mL) into the skin once a week. 05/02/21   Helane Rima, DO  tirzepatide [facility] 12.5 MG/0.5ML Pen Inject 12.5 mg into the skin once a week. 06/23/21   Helane Rima, DO  tirzepatide [facility]) 15 MG/0.5ML Pen Inject 15 mg into the skin once a week. 06/23/21   Helane Rima, DO  tirzepatide [facility]) 15 MG/0.5ML Pen Inject 15mg  (0.4mL) under the skin once weekly 10/03/21     tirzepatide (ZEPBOUND) 15 MG/0.5ML Pen Inject 15mg  (0.58mL) under the skin once weekly 05/30/22     tirzepatide (ZEPBOUND) 15 MG/0.5ML Pen Inject 15 mg into the skin once a week. 07/20/22     tirzepatide (ZEPBOUND) 15 MG/0.5ML Pen Inject 15 mg into the skin once a week. 04/07/23     traZODone (DESYREL) 50 MG tablet Take 1 tablet (50 mg total) by mouth  at bedtime. 05/02/21   Helane Rima, DO  traZODone (DESYREL) 50 MG tablet Take 1 tablet (50 mg total) by mouth at bedtime as needed for sleep 02/22/22     traZODone (DESYREL) 50 MG tablet Take 1 tablet (50 mg total) by mouth at bedtime. 02/28/22     traZODone (DESYREL) 50 MG tablet Take 1 tablet by mouth once daily at bedtime 05/30/22     traZODone (DESYREL) 50 MG tablet Take 1 tablet (50 mg total) by mouth at bedtime. 09/28/22     traZODone (DESYREL) 50 MG tablet Take 1 tablet (50 mg total) by mouth at bedtime. 04/16/23     trimethoprim-polymyxin b (POLYTRIM) ophthalmic solution Place 1 drop into the left eye every 6 (six) hours. 04/16/21   Junie Spencer, FNP      Allergies    Cephalosporins    Review of Systems    Review of Systems  Cardiovascular:  Positive for chest pain.  Review of systems completed and notable as per HPI.  ROS otherwise negative.   Physical Exam Updated Vital Signs BP (!) 139/94   Pulse 84   Temp 98.6 F (37 C)   Resp 15   Wt 111.1 kg   SpO2 100%   BMI 31.46 kg/m  Physical Exam Vitals and nursing note reviewed.  Constitutional:      General: Andre Perez is not in acute distress.    Appearance: Andre Perez is well-developed.  HENT:     Head: Normocephalic and atraumatic.  Eyes:     Extraocular Movements: Extraocular movements intact.     Conjunctiva/sclera: Conjunctivae normal.     Pupils: Pupils are equal, round, and reactive to light.  Cardiovascular:     Rate and Rhythm: Normal rate and regular rhythm.     Pulses: Normal pulses.     Heart sounds: Normal heart sounds. No murmur heard. Pulmonary:     Effort: Pulmonary effort is normal. No respiratory distress.     Breath sounds: Normal breath sounds.  Abdominal:     Palpations: Abdomen is soft.     Tenderness: There is no abdominal tenderness.  Musculoskeletal:        General: No swelling.     Cervical back: Neck supple.     Right lower leg: No edema.     Left lower leg: No edema.     Comments: Mild pain with range of motion of left shoulder.  No focal tenderness.  No skin changes.  2+ radial pulse bilaterally.  Symmetric grip strength and strength is normal at the shoulder, elbow, wrist, fingers.  Normal sensation throughout.  Skin:    General: Skin is warm and dry.     Capillary Refill: Capillary refill takes less than 2 seconds.  Neurological:     General: No focal deficit present.     Mental Status: Andre Perez is alert and oriented to person, place, and time. Mental status is at baseline.  Psychiatric:        Mood and Affect: Mood normal.     ED Results / Procedures / Treatments   Labs (all labs ordered are listed, but only abnormal results are displayed) Labs Reviewed  BASIC METABOLIC PANEL  CBC  TROPONIN I (HIGH  SENSITIVITY)  TROPONIN I (HIGH SENSITIVITY)    EKG EKG Interpretation Date/Time:  Thursday May 03 2023 12:09:20 EST Ventricular Rate:  96 PR Interval:  160 QRS Duration:  94 QT Interval:  344 QTC Calculation: 434 R Axis:   60  Text Interpretation: Normal  sinus rhythm Normal ECG When compared with ECG of 06-Mar-2021 09:17, PREVIOUS ECG IS PRESENT Confirmed by Fulton Reek [phone]) on 05/03/2023 3:55:45 PM  Radiology CT Head Wo Contrast Result Date: 05/03/2023 CLINICAL DATA:  Headache, increasing frequency or severity. Dizziness. EXAM: CT HEAD WITHOUT CONTRAST TECHNIQUE: Contiguous axial images were obtained from the base of the skull through the vertex without intravenous contrast. RADIATION DOSE REDUCTION: This exam was performed according to the departmental dose-optimization program which includes automated exposure control, adjustment of the mA and/or kV according to patient size and/or use of iterative reconstruction technique. COMPARISON:  None Available. FINDINGS: Brain: There is no evidence of an acute infarct, intracranial hemorrhage, mass, midline shift, or extra-axial fluid collection. Cerebral volume is normal. The ventricles are normal in size. Vascular: No hyperdense vessel. Skull: No fracture or suspicious lesion. Sinuses/Orbits: Visualized paranasal sinuses and mastoid air cells are clear. Unremarkable orbits. Other: None. IMPRESSION: Negative head CT. Electronically Signed   By: Sebastian Ache M.D.   On: 05/03/2023 17:17   DG Chest 2 View Result Date: 05/03/2023 CLINICAL DATA:  Chest pain and dizziness. EXAM: CHEST - 2 VIEW COMPARISON:  CT chest and chest x-ray dated March 06, 2021. FINDINGS: The heart size and mediastinal contours are within normal limits. Normal pulmonary vascularity. No focal consolidation, pleural effusion, or pneumothorax. No acute osseous abnormality. Old left clavicle fracture. IMPRESSION: No active cardiopulmonary disease. Electronically Signed    By: Obie Dredge M.D.   On: 05/03/2023 15:06    Procedures Procedures    Medications Ordered in ED Medications  acetaminophen (TYLENOL) tablet 1,000 mg (1,000 mg Oral Given 05/03/23 1636)  sodium chloride 0.9 % bolus 1,000 mL (1,000 mLs Intravenous New Bag/Given 05/03/23 1640)    ED Course/ Medical Decision Making/ A&P                                 Medical Decision Making Amount and/or Complexity of Data Reviewed Labs: ordered. Radiology: ordered.  Risk OTC drugs.   Medical Decision Making:   Andre Perez is a 38 y.o. male who presented to the ED today with dizziness, left arm and chest pain.  Vital signs reviewed.  On exam Andre Perez is well-appearing.  Andre Perez is somewhat anxious.  Andre Perez reports few days of dizziness and swimmy headedness as well as new headache.  No signs of CNS infection.  Andre Perez has normal neurologic exam, obtain CT head to rule out trauma given new headache and this dizziness although it seems to be improving.  Andre Perez is most concerned about the chest and left arm pain.  Andre Perez has no signs of trauma or neurovascular compromise.  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  We will try troponin given continued chest pain, though relatively low risk for ACS.  I have lower suspicion for dissection, Andre Perez is PERC negative for PE.   Patient placed on continuous vitals and telemetry monitoring while in ED which was reviewed periodically.  Reviewed and confirmed nursing documentation for past medical history, family history, social history.  Reassessment and Plan:   On reassessment Andre Perez is feeling better after Tylenol.  CT head is unremarkable.  Chest x-ray is unremarkable.  Troponin is negative x 2, lower concern for ACS.  I wonder if this may be more muscle skeletal or related to his chronic cervical radiculitis.  I do not see  an indication for imaging of his neck at this time given no weakness or numbness or neck pain.  I  recommend Andre Perez follow close with his primary care doctor and sports medicine provider.  Strict return precautions were given.  Andre Perez is comfortable with this plan.   Patient's presentation is most consistent with acute complicated illness / injury requiring diagnostic workup.           Final Clinical Impression(s) / ED Diagnoses Final diagnoses:  Atypical chest pain    Rx / DC Orders ED Discharge Orders     None         Laurence Spates, MD 05/03/23 1746

## 2023-05-03 NOTE — Discharge Instructions (Signed)
Your workup today was reassuring.  I recommend you follow close with your sports medicine provider and your primary care doctor.  If you develop any new or concerning symptoms including severe pain, neurologic changes, severe chest pain or shortness of breath you should return to the ED.

## 2023-05-07 ENCOUNTER — Other Ambulatory Visit (HOSPITAL_COMMUNITY): Payer: Self-pay

## 2023-05-14 DIAGNOSIS — Z472 Encounter for removal of internal fixation device: Secondary | ICD-10-CM | POA: Diagnosis not present

## 2023-05-14 DIAGNOSIS — T8484XA Pain due to internal orthopedic prosthetic devices, implants and grafts, initial encounter: Secondary | ICD-10-CM | POA: Diagnosis not present

## 2023-05-14 DIAGNOSIS — S42002D Fracture of unspecified part of left clavicle, subsequent encounter for fracture with routine healing: Secondary | ICD-10-CM | POA: Diagnosis not present

## 2023-05-17 ENCOUNTER — Other Ambulatory Visit (HOSPITAL_COMMUNITY): Payer: Self-pay

## 2023-05-19 ENCOUNTER — Other Ambulatory Visit (HOSPITAL_COMMUNITY): Payer: Self-pay

## 2023-05-19 MED ORDER — PANTOPRAZOLE SODIUM 40 MG PO TBEC
40.0000 mg | DELAYED_RELEASE_TABLET | Freq: Every day | ORAL | 3 refills | Status: DC | PRN
  Filled 2023-05-19: qty 30, 30d supply, fill #0
  Filled 2023-06-20: qty 30, 30d supply, fill #1
  Filled 2023-07-18: qty 30, 30d supply, fill #2
  Filled 2023-08-21: qty 30, 30d supply, fill #3

## 2023-05-30 DIAGNOSIS — M25512 Pain in left shoulder: Secondary | ICD-10-CM | POA: Diagnosis not present

## 2023-06-04 DIAGNOSIS — Z0282 Encounter for adoption services: Secondary | ICD-10-CM | POA: Diagnosis not present

## 2023-06-20 ENCOUNTER — Other Ambulatory Visit (HOSPITAL_COMMUNITY): Payer: Self-pay

## 2023-06-21 ENCOUNTER — Other Ambulatory Visit: Payer: Self-pay

## 2023-06-21 ENCOUNTER — Other Ambulatory Visit (HOSPITAL_COMMUNITY): Payer: Self-pay

## 2023-06-21 DIAGNOSIS — M25512 Pain in left shoulder: Secondary | ICD-10-CM | POA: Diagnosis not present

## 2023-06-21 DIAGNOSIS — M7502 Adhesive capsulitis of left shoulder: Secondary | ICD-10-CM | POA: Diagnosis not present

## 2023-06-21 MED ORDER — MELOXICAM 15 MG PO TABS
15.0000 mg | ORAL_TABLET | Freq: Every day | ORAL | 0 refills | Status: DC
  Filled 2023-07-30: qty 90, 90d supply, fill #0

## 2023-06-21 MED ORDER — ZEPBOUND 15 MG/0.5ML ~~LOC~~ SOAJ
15.0000 mg | SUBCUTANEOUS | 0 refills | Status: DC
Start: 2023-06-21 — End: 2023-07-20
  Filled 2023-06-21: qty 2, 28d supply, fill #0

## 2023-06-28 DIAGNOSIS — M25512 Pain in left shoulder: Secondary | ICD-10-CM | POA: Diagnosis not present

## 2023-06-28 DIAGNOSIS — M7502 Adhesive capsulitis of left shoulder: Secondary | ICD-10-CM | POA: Diagnosis not present

## 2023-07-05 DIAGNOSIS — M7502 Adhesive capsulitis of left shoulder: Secondary | ICD-10-CM | POA: Diagnosis not present

## 2023-07-05 DIAGNOSIS — M25512 Pain in left shoulder: Secondary | ICD-10-CM | POA: Diagnosis not present

## 2023-07-12 ENCOUNTER — Other Ambulatory Visit (HOSPITAL_COMMUNITY): Payer: Self-pay

## 2023-07-16 ENCOUNTER — Other Ambulatory Visit (HOSPITAL_COMMUNITY): Payer: Self-pay

## 2023-07-16 DIAGNOSIS — R351 Nocturia: Secondary | ICD-10-CM | POA: Diagnosis not present

## 2023-07-16 DIAGNOSIS — M898X1 Other specified disorders of bone, shoulder: Secondary | ICD-10-CM | POA: Diagnosis not present

## 2023-07-16 DIAGNOSIS — G4733 Obstructive sleep apnea (adult) (pediatric): Secondary | ICD-10-CM | POA: Diagnosis not present

## 2023-07-16 DIAGNOSIS — Z79899 Other long term (current) drug therapy: Secondary | ICD-10-CM | POA: Diagnosis not present

## 2023-07-16 DIAGNOSIS — R632 Polyphagia: Secondary | ICD-10-CM | POA: Diagnosis not present

## 2023-07-16 MED ORDER — NORTRIPTYLINE HCL 25 MG PO CAPS
25.0000 mg | ORAL_CAPSULE | Freq: Every day | ORAL | 0 refills | Status: DC
  Filled 2023-07-16: qty 30, 30d supply, fill #0

## 2023-07-17 ENCOUNTER — Other Ambulatory Visit (HOSPITAL_COMMUNITY): Payer: Self-pay

## 2023-07-17 ENCOUNTER — Other Ambulatory Visit: Payer: Self-pay

## 2023-07-19 ENCOUNTER — Other Ambulatory Visit (HOSPITAL_COMMUNITY): Payer: Self-pay

## 2023-07-19 DIAGNOSIS — M7502 Adhesive capsulitis of left shoulder: Secondary | ICD-10-CM | POA: Diagnosis not present

## 2023-07-19 DIAGNOSIS — M25512 Pain in left shoulder: Secondary | ICD-10-CM | POA: Diagnosis not present

## 2023-07-20 ENCOUNTER — Other Ambulatory Visit (HOSPITAL_COMMUNITY): Payer: Self-pay

## 2023-07-20 MED ORDER — ZEPBOUND 15 MG/0.5ML ~~LOC~~ SOAJ
15.0000 mg | SUBCUTANEOUS | 1 refills | Status: DC
Start: 2023-07-20 — End: 2023-09-20
  Filled 2023-07-20: qty 2, 28d supply, fill #0
  Filled 2023-08-21: qty 2, 28d supply, fill #1

## 2023-07-31 ENCOUNTER — Other Ambulatory Visit: Payer: Self-pay

## 2023-07-31 ENCOUNTER — Other Ambulatory Visit (HOSPITAL_COMMUNITY): Payer: Self-pay

## 2023-08-02 DIAGNOSIS — M25512 Pain in left shoulder: Secondary | ICD-10-CM | POA: Diagnosis not present

## 2023-08-02 DIAGNOSIS — M7502 Adhesive capsulitis of left shoulder: Secondary | ICD-10-CM | POA: Diagnosis not present

## 2023-08-09 ENCOUNTER — Other Ambulatory Visit: Payer: Self-pay

## 2023-08-09 DIAGNOSIS — L089 Local infection of the skin and subcutaneous tissue, unspecified: Secondary | ICD-10-CM | POA: Insufficient documentation

## 2023-08-09 DIAGNOSIS — Z79899 Other long term (current) drug therapy: Secondary | ICD-10-CM | POA: Diagnosis not present

## 2023-08-09 DIAGNOSIS — M542 Cervicalgia: Secondary | ICD-10-CM | POA: Insufficient documentation

## 2023-08-09 DIAGNOSIS — L539 Erythematous condition, unspecified: Secondary | ICD-10-CM | POA: Insufficient documentation

## 2023-08-09 NOTE — ED Triage Notes (Signed)
 Pt POV reporting possible insect bite on R side of neck, redness noted to area, first noticed yesterday.

## 2023-08-10 ENCOUNTER — Emergency Department (HOSPITAL_BASED_OUTPATIENT_CLINIC_OR_DEPARTMENT_OTHER)
Admission: EM | Admit: 2023-08-10 | Discharge: 2023-08-10 | Disposition: A | Discharge status: Home / Self Care | Attending: Emergency Medicine | Admitting: Emergency Medicine

## 2023-08-10 ENCOUNTER — Other Ambulatory Visit (HOSPITAL_COMMUNITY): Payer: Self-pay

## 2023-08-10 DIAGNOSIS — L089 Local infection of the skin and subcutaneous tissue, unspecified: Secondary | ICD-10-CM

## 2023-08-10 MED ORDER — DOXYCYCLINE HYCLATE 100 MG PO CAPS
100.0000 mg | ORAL_CAPSULE | Freq: Two times a day (BID) | ORAL | 0 refills | Status: DC
  Filled 2023-08-10: qty 14, 7d supply, fill #0

## 2023-08-10 NOTE — ED Provider Notes (Signed)
 [name]: [id number] Arrival date & time: 08/09/23  2224     History  Chief Complaint  Patient presents with   Insect Bite    Andre Perez is a 38 y.o. male.  The history is provided by the patient.  Patient reports area of redness and mild pain to his right lower neck.  No trauma.  He does not recall any insect sting. No fevers or vomiting.  No other rashes reported.  No itching.  No tongue or facial swelling     Home Medications Prior to Admission medications   Medication Sig Start Date End Date Taking? Authorizing Provider  doxycycline (VIBRAMYCIN) 100 MG capsule Take 1 capsule (100 mg total) by mouth 2 (two) times daily. One po bid x 7 days 08/10/23  Yes Eldon Greenland, MD  clomiPHENE  (CLOMID ) 50 MG tablet Take 25 mg by mouth 2 (two) times a week. 11/15/20   [provider]  clomiPHENE  (CLOMID ) 50 MG tablet Take 1/2 tablet (25mg  total) by mouth twice weekly 10/03/21     clomiPHENE  (CLOMID ) 50 MG tablet Take 0.5 tablets (25 mg total) by mouth 2 (two) times a week. 03/02/22     clomiPHENE  (CLOMID ) 50 MG tablet Take 1/2 tablet (25 mg total) by mouth two times per week 02/08/23     meloxicam  (MOBIC ) 15 MG tablet Take 1 tablet (15 mg total) by mouth daily. 06/21/23     Multiple Vitamins-Minerals (MULTIVITAMIN & MINERAL PO) Take by mouth.    [provider]  Naproxen Sodium (ALEVE PO) Take by mouth as needed.    [provider]  nortriptyline  (PAMELOR ) 25 MG capsule Take 1 capsule (25 mg total) by mouth at bedtime. 07/16/23     pantoprazole  (PROTONIX ) 40 MG tablet Take 40 mg by mouth daily as needed. 07/18/19   [provider]  pantoprazole  (PROTONIX ) 40 MG tablet Take 1 tablet (40 mg total) by mouth daily as needed. 03/23/22     pantoprazole  (PROTONIX ) 40 MG tablet Take 1 tablet (40 mg total) by mouth daily as needed. 05/19/23     phentermine  (ADIPEX-P ) 37.5 MG tablet Take 1/2-1  tablets (18.75-37.5 mg total) by mouth daily before breakfast. 06/23/21   Jonn Nett, DO  phentermine  (ADIPEX-P ) 37.5 MG tablet Take 0.5-1 tablets (18.75-37.5 mg total) by mouth daily before breakfast. 12/11/22     tirzepatide  (MOUNJARO ) 10 MG/0.5ML Pen Inject 1 pen (0.5 mL) into the skin once a week. 05/02/21   Jonn Nett, DO  tirzepatide  (MOUNJARO ) 12.5 MG/0.5ML Pen Inject 12.5 mg into the skin once a week. 06/23/21   Jonn Nett, DO  tirzepatide  (MOUNJARO ) 15 MG/0.5ML Pen Inject 15 mg into the skin once a week. 06/23/21   Jonn Nett, DO  tirzepatide  (MOUNJARO ) 15 MG/0.5ML Pen Inject 15mg  (0.22mL) under the skin once weekly 10/03/21     tirzepatide  (ZEPBOUND ) 15 MG/0.5ML Pen Inject 15mg  (0.41mL) under the skin once weekly 05/30/22     tirzepatide  (ZEPBOUND ) 15 MG/0.5ML Pen Inject 15 mg into the skin once a week. 07/20/22     tirzepatide  (ZEPBOUND ) 15 MG/0.5ML Pen Inject 15 mg into the skin once a weekly 07/20/23     traZODone  (DESYREL ) 50 MG tablet Take 1 tablet (50 mg total) by mouth at bedtime. 05/02/21   Jonn Nett, DO  traZODone  (DESYREL ) 50 MG tablet Take 1 tablet (50 mg total) by mouth at bedtime as needed for sleep 02/22/22     traZODone  (  DESYREL ) 50 MG tablet Take 1 tablet (50 mg total) by mouth at bedtime. 02/28/22     traZODone  (DESYREL ) 50 MG tablet Take 1 tablet by mouth once daily at bedtime 05/30/22     traZODone  (DESYREL ) 50 MG tablet Take 1 tablet (50 mg total) by mouth at bedtime. 09/28/22     traZODone  (DESYREL ) 50 MG tablet Take 1 tablet (50 mg total) by mouth at bedtime as needed. 04/16/23     trimethoprim -polymyxin b  (POLYTRIM ) ophthalmic solution Place 1 drop into the left eye every 6 (six) hours. 04/16/21   Yevette Hem, FNP      Allergies    Cephalosporins    Review of Systems   Review of Systems  Physical Exam Updated Vital Signs BP 139/86   Pulse 91   Temp 98.2 F (36.8 C)   Resp 19   Ht 1.88 m (6\' 2" )   Wt 104.3 kg   SpO2 100%   BMI 29.53 kg/m   Physical Exam CONSTITUTIONAL: Well developed/well nourished HEAD: Normocephalic/atraumatic ENMT: Mucous membranes moist, uvula midline, no stridor, no drooling, no angioedema NECK: supple no meningeal signs Erythema is noted to the right lower neck.  No crepitus, no fluctuance.  Minimal tenderness noted.  There is no streaking noted.  No edema  see photo There is no supraclavicular lymphadenopathy CV: S1/S2 noted, no murmurs/rubs/gallops noted LUNGS: Lungs are clear to auscultation bilaterally, no apparent distress NEURO: Pt is awake/alert/appropriate, moves all extremitiesx4.  No facial droop.   SKIN: warm, color normal  ED Results / Procedures / Treatments   Labs (all labs ordered are listed, but only abnormal results are displayed) Labs Reviewed - No data to display  EKG None  Radiology No results found.  Procedures Procedures    Medications Ordered in ED Medications - No data to display  ED Course/ Medical Decision Making/ A&P                                 Medical Decision Making Risk Prescription drug management.   Patient presents with area of erythema to his right lower neck.  Could be early cellulitis.  No signs of abscess.  Also no signs of allergic type reaction. Patient safe for outpatient management.  Patient has cephalosporin allergy, will start on doxycycline        Final Clinical Impression(s) / ED Diagnoses Final diagnoses:  Skin infection    Rx / DC Orders ED Discharge Orders          Ordered    doxycycline (VIBRAMYCIN) 100 MG capsule  2 times daily        08/10/23 0237              Eldon Greenland, MD 08/10/23 [phone]

## 2023-08-22 ENCOUNTER — Other Ambulatory Visit (HOSPITAL_BASED_OUTPATIENT_CLINIC_OR_DEPARTMENT_OTHER): Payer: Self-pay

## 2023-08-23 ENCOUNTER — Encounter: Payer: Self-pay | Admitting: Family Medicine

## 2023-08-23 ENCOUNTER — Ambulatory Visit: Admitting: Family Medicine

## 2023-08-23 VITALS — BP 138/88 | HR 96 | Ht 74.0 in | Wt 245.0 lb

## 2023-08-23 DIAGNOSIS — M79602 Pain in left arm: Secondary | ICD-10-CM | POA: Diagnosis not present

## 2023-08-23 DIAGNOSIS — R42 Dizziness and giddiness: Secondary | ICD-10-CM

## 2023-08-23 DIAGNOSIS — M5412 Radiculopathy, cervical region: Secondary | ICD-10-CM

## 2023-08-23 DIAGNOSIS — G540 Brachial plexus disorders: Secondary | ICD-10-CM | POA: Diagnosis not present

## 2023-08-23 DIAGNOSIS — R4189 Other symptoms and signs involving cognitive functions and awareness: Secondary | ICD-10-CM

## 2023-08-23 NOTE — Progress Notes (Signed)
 0        I, Miquel Amen, CMA acting as a scribe for Garlan Juniper, MD.  Andre Perez is a 38 y.o. male who presents to [facility] at [facility] today for cont'd L arm pain. Pt was last seen by Dr. Alease Hunter on 01/22/23 and a cervical MRI was ordered. Based on findings, cervical ESI ordered, done on 03/05/23.  Today, pt reports some improvement of sx s/p ESI. Having flare up of right shoulder pain. Continued nerve-type sx in the left arm. C/O worsening dizziness. Notes an occurrence of chest tightness, had eval at the ED. Curious about thoracic outlet syndrome. Had clavicle pin removed in Feb 2025. Continued weakness in the left arm and n/t in the 1st three digits. Notes newer sx, TMJ, provided with HEP by PT. Going for PT every other week. Also c/o newer fatigue symptoms. Denies GI sx.   Notes brain fog and vertigo/dizziness, and left hand and arm paresthesia involving the radial hand first 3 digits.  Dx testing: 02/05/23 C-spine MRI 01/22/23 C-spine XR 01/18/23 NCV study  Pertinent review of systems: No fevers or chills  Relevant historical information: No personal history of ADHD but looking back he thinks he has probably had it most of his adult life.   Exam:  BP 138/88   Pulse 96   Ht 6\' 2"  (1.88 m)   Wt 245 lb (111.1 kg)   SpO2 99%   BMI 31.46 kg/m  General: Well Developed, well nourished, and in no acute distress.   MSK: Left arm normal-appearing normal shoulder motion intact strength.  Psych: Alert and oriented normal speech thought process and affect.  Adult ADHD Self Report Scale (most recent)     Adult ADHD Self-Report Scale (ASRS-v1.1) Symptom Checklist - 08/23/23 1332       Part A   1. How often do you have trouble wrapping up the final details of a project, once the challenging parts have been done? Often  2. How often do you have difficulty getting things done in order when you have to do a task that requires organization? Sometimes    3. How often  do you have problems remembering appointments or obligations? Rarely  4. When you have a task that requires a lot of thought, how often do you avoid or delay getting started? Sometimes    5. How often do you fidget or squirm with your hands or feet when you have to sit down for a long time? Very Often  6. How often do you feel overly active and compelled to do things, like you were driven by a motor? Very Often      Part B   7. How often do you make careless mistakes when you have to work on a boring or difficult project? Sometimes  8. How often do you have difficulty keeping your attention when you are doing boring or repetitive work? Often    9. How often do you have difficulty concentrating on what people say to you, even when they are speaking to you directly? Sometimes  10. How often do you misplace or have difficulty finding things at home or at work? Sometimes    11. How often are you distracted by activity or noise around you? Often  12. How often do you leave your seat in meetings or other situations in which you are expected to remain seated? Rarely    13. How often do you feel restless or fidgety? Often  14. How often do you have difficulty unwinding and relaxing when you have time to yourself? Very Often    15. How often do you find yourself talking too much when you are in social situations? Sometimes  16. When you are in a conversation, how often do you find yourself finishing the sentences of the people you are talking to, before they can finish them themselves? Sometimes    17. How often do you have difficulty waiting your turn in situations when turn taking is required? Rarely  18. How often do you interrupt others when they are busy? Sometimes      Comment   How old were you when these problems first began to occur? 8                 Lab and Radiology Results  Cervical spine MRI November 2024  IMPRESSION: 1. Normal MRI appearance of the cervical spinal cord. No cord  signal changes to suggest myelopathy. 2. Right paracentral disc protrusion with uncovertebral spurring at C4-5 with resultant mild spinal stenosis, with mild left C5 foraminal narrowing. 3. Additional mild noncompressive disc bulging at C2-3 through C7-T1 without significant stenosis or impingement  Nerve conduction study October 2024:  NCV & EMG Findings: Extensive electrodiagnostic testing of the left upper extremity shows:  Left median sensory response shows reduced amplitude (L8.1 V).  Left ulnar, radial, mixed palmar, medial antebrachial, and lateral antebrachial cutaneous sensory responses are within normal limits.  Left median and ulnar motor responses are within normal limits.  Chronic motor axonal loss changes are seen affecting the left pronator teres, flexor pollicis longus, and abductor pollicis brevis muscle, without accompanied active denervation.   Impression: The electrophysiologic findings are consistent with a chronic left median neuropathy proximal to the branch point to the pronator teres muscle.  Overall, these findings are mild-to-moderate in degree electrically.      Assessment and Plan: 38 y.o. male with left arm paresthesias.  Patient had a clavicle fracture that needed to be repaired surgically that was a bit challenging.  He had a retained surgical pin that has since been removed.  He continues to experience left hand paresthesias and a median nerve distribution.  Based on his nerve conduction study the area of injury or irritation is above the pronator teres probably in the brachial plexus area.  Plan for brachial plexus MRI as his symptoms are still present.  The surgical pain has been removed so we should have a good look at the area on MRI without any artifact.  Vertigo: Patient is experiencing dizziness/vertigo.  Plan refer to vestibular therapy for evaluation and treatment.  Suspect BPPV.  Brain fog: Patient certainly meets criteria for ADHD based on his  ASRS score.  He probably has had it his entire life and has worsening symptoms recently.  He is a good candidate for Vyvanse or similar.  He will talk it over with another one of his providers Dr. Lajuana Pilar next week.  Happy to prescribe if he would like me to.   PDMP not reviewed this encounter. Orders Placed This Encounter  Procedures   MR BRACHIAL PLEXUS W/O CM LT    Standing Status:   Future    Expiration Date:   08/22/2024    What is the patient's sedation requirement?:   No Sedation    Does the patient have a pacemaker or implanted devices?:   No    Preferred imaging location?:   [facility] (table limit-550lbs)  Ambulatory referral to Physical Therapy    Referral Priority:   Routine    Referral Type:   Physical Medicine    Referral Reason:   Specialty Services Required    Requested Specialty:   Physical Therapy    Number of Visits Requested:   1   No orders of the defined types were placed in this encounter.    Discussed warning signs or symptoms. Please see discharge instructions. Patient expresses understanding.   The above documentation has been reviewed and is accurate and complete Garlan Juniper, M.D.

## 2023-08-23 NOTE — Patient Instructions (Addendum)
 Thank you for coming in today.   I've ordered a MRI of your Brachial Plexus. You should hear from MRI scheduling within 1 week. If you do not hear please let me know.    I've referred you to Vestibular Therapy.  Let us  know if you don't hear from them in one week.   Talk with Dr. Lajuana Pilar about Vyvanse  Check back after we get the test results back.

## 2023-08-29 DIAGNOSIS — M898X1 Other specified disorders of bone, shoulder: Secondary | ICD-10-CM | POA: Diagnosis not present

## 2023-08-29 DIAGNOSIS — G4733 Obstructive sleep apnea (adult) (pediatric): Secondary | ICD-10-CM | POA: Diagnosis not present

## 2023-08-29 DIAGNOSIS — R351 Nocturia: Secondary | ICD-10-CM | POA: Diagnosis not present

## 2023-08-29 DIAGNOSIS — R632 Polyphagia: Secondary | ICD-10-CM | POA: Diagnosis not present

## 2023-09-03 ENCOUNTER — Ambulatory Visit: Attending: Family Medicine

## 2023-09-03 DIAGNOSIS — R42 Dizziness and giddiness: Secondary | ICD-10-CM | POA: Diagnosis not present

## 2023-09-03 DIAGNOSIS — M542 Cervicalgia: Secondary | ICD-10-CM | POA: Diagnosis not present

## 2023-09-03 NOTE — Therapy (Signed)
 OUTPATIENT PHYSICAL THERAPY VESTIBULAR EVALUATION     Patient Name: Andre Perez MRN: [id number] DOB:[birth year], 38 y.o., male Today's Date: 09/03/2023  END OF SESSION:  PT End of Session - 09/03/23 1405     Visit Number 1    Authorization Type BCBS    PT Start Time 1402    PT Stop Time 1445    PT Time Calculation (min) 43 min          Past Medical History:  Diagnosis Date   Anxiety    Back pain    GERD (gastroesophageal reflux disease)    Infertility male    Lactose intolerance    Obesity    Sleep apnea    Past Surgical History:  Procedure Laterality Date   TESTICLE REMOVAL     TESTICULAR PROSTHETIC INSERTION     Patient Active Problem List   Diagnosis Date Noted   Gastroesophageal reflux disease 04/19/2020   Gynecomastia 04/19/2020   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  Since around January he has noticed increasing issues of dizziness and motion sensitivity.  Reports he had issue of motion sensitivity throughout childhood, less so in adult life until last year.  Notes imbalance when changing from positions but does not note sensation of spinning. Notes some instances of photo/phonophobia and a feeling of off balance Pt accompanied by: self  PERTINENT HISTORY:   PAIN:  Are you having pain? Yes: NPRS scale: 4/10 Pain location: middle-right of neck Pain  description: sharp/ache Aggravating factors: lifting, sitting w/ poor posture Relieving factors: rest  PRECAUTIONS: None  RED FLAGS: None   WEIGHT BEARING RESTRICTIONS: No  FALLS: Has patient fallen in last 6 months? No  LIVING ENVIRONMENT: Lives with: lives with their family Lives in: House/apartment Stairs:  Has following equipment at home: None  PLOF: Independent, works as Pensions consultant  PATIENT GOALS: address symptoms  OBJECTIVE:  Note: Objective measures were completed at Evaluation unless otherwise noted.  DIAGNOSTIC FINDINGS: Cervical spine MRI November 2024  IMPRESSION: 1. Normal MRI appearance of the cervical spinal cord. No cord signal changes to suggest myelopathy. 2. Right paracentral disc protrusion with uncovertebral spurring at C4-5 with resultant mild spinal stenosis, with mild left C5 foraminal narrowing. 3. Additional mild noncompressive disc bulging at C2-3 through C7-T1 without significant stenosis or impingement  NCV & EMG Findings: Extensive electrodiagnostic testing of the left upper extremity shows:  Left median sensory response shows reduced amplitude (L8.1 V).  Left ulnar, radial, mixed palmar, medial antebrachial, and lateral antebrachial cutaneous sensory responses are within normal limits.  Left median and ulnar motor responses are within normal limits.  Chronic motor axonal loss changes are seen affecting the left pronator teres, flexor pollicis longus, and abductor pollicis brevis muscle, without accompanied active denervation.   Impression: The electrophysiologic findings are consistent with a chronic left median neuropathy proximal to the branch point to the pronator teres muscle.  Overall, these findings are mild-to-moderate in degree electrically.  COGNITION: Overall cognitive status: Within functional limits for tasks assessed     POSTURE:  No Significant postural limitations  Cervical ROM:    Active A/PROM (deg) eval  Flexion  40  Extension 50  Right lateral flexion 45  Left lateral flexion 30  Right rotation 60  Left rotation 70  (Blank rows = not tested) ---cervical extension provocative to symtoms: swimmyhead/queasy   STRENGTH:   LOWER EXTREMITY MMT:   MMT Right eval Left eval  Hip flexion    Hip abduction    Hip adduction    Hip internal rotation    Hip external rotation    Knee flexion    Knee extension    Ankle dorsiflexion    Ankle plantarflexion    Ankle inversion    Ankle eversion    (Blank rows = not tested)   GAIT: Gait pattern: WFL Distance walked:  Assistive device utilized: None Level of assistance: Complete Independence Comments:   FUNCTIONAL TESTS:  Functional gait assessment: TBD  M-CTSIB  Condition 1: Firm Surface, EO 30 Sec, Normal Sway  Condition 2: Firm Surface, EC 30 Sec, Normal Sway  Condition 3: Foam Surface, EO 30 Sec, Normal Sway  Condition 4: Foam Surface, EC 30 Sec, Moderate Sway    PATIENT SURVEYS:  DHI 32  VESTIBULAR ASSESSMENT:  GENERAL OBSERVATION: wears prescription glasses   SYMPTOM BEHAVIOR:  Subjective history: approx 1 year onset of dizziness/motion sensitivity  Non-Vestibular symptoms: neck pain  Type of dizziness: Imbalance (Disequilibrium), Unsteady with head/body turns, and Swimmyheaded  Frequency: daily/episodic  Duration: seconds/minutes  Aggravating factors: Induced by position change: lying supine, supine to sit, and sit to stand, Induced by motion: looking up at the ceiling, bending down to the ground, turning body quickly, and turning head quickly, and Worse in the dark  Relieving factors: slow movements and avoid busy/distracting environments  Progression of symptoms: unchanged  OCULOMOTOR EXAM:  Ocular Alignment: normal  Ocular ROM: No Limitations  Spontaneous Nystagmus: absent  Gaze-Induced Nystagmus: absent--reports symptom provocation looking up  Smooth Pursuits: intact  Saccades: intact--vertical  symptomatic  Convergence/Divergence: 6 cm --right eye loses fusion after 3 sec    VESTIBULAR - OCULAR REFLEX:   Slow VOR: Comment: horizontal < vertical for swimmy-headed  VOR Cancellation: Normal  Head-Impulse Test: HIT Right: negative HIT Left: negative  Dynamic Visual Acuity: Not able to be assessed   POSITIONAL TESTING: Right Dix-Hallpike: no nystagmus Left Dix-Hallpike: no nystagmus Right Roll Test: no nystagmus Left Roll Test: no nystagmus  MOTION SENSITIVITY:  Motion Sensitivity Quotient Intensity: 0 = none, 1 = Lightheaded, 2 = Mild, 3 = Moderate, 4 = Severe, 5 = Vomiting  Intensity  1. Sitting to supine   2. Supine to L side   3. Supine to R side   4. Supine to sitting   5. L Hallpike-Dix   6. Up from L    7. R Hallpike-Dix   8. Up from R    9. Sitting, head tipped to L knee   10. Head up from L knee   11. Sitting, head tipped to R knee   12. Head up from R knee   13. Sitting head turns x5   14.Sitting head nods x5   15. In stance, 180 turn to L    16. In stance, 180 turn to R     OTHOSTATICS: not done  TREATMENT DATE: 09/03/23  See below for HEP development  PATIENT EDUCATION: Education details: assessment details, HEP initiation Person educated: Patient Education method: Explanation Education comprehension: verbalized understanding  HOME EXERCISE PROGRAM: Access Code: [email or web address] URL: [email or web address] Date: 09/03/2023 Prepared by: Tedd Favorite  Exercises - Seated Gaze Stabilization with Head Rotation  - 1 x daily - 7 x weekly - 3-5 sets - 15-30 sec hold - Seated Gaze Stabilization with Head Nod  - 1 x daily - 7 x weekly - 3-5 sets - 30 sec hold - Corner Balance Feet Together With Eyes Closed  - 1 x daily - 7 x weekly - 3 sets - 30 sec hold - Corner Balance Feet Together: Eyes Closed With Head Turns  - 1  x daily - 7 x weekly - 3 sets - 30 sec hold  GOALS: Goals reviewed with patient? Yes  SHORT TERM GOALS: Target date: same as LTG   LONG TERM GOALS: Target date: 10/08/2023    The patient will be independent with HEP for gaze adaptation, habituation, balance, and general mobility. Baseline:  Goal status: INITIAL  2.  FGA to be performed and goal constructed Baseline: TBD Goal status: INITIAL  3.  Demo improved postural stability/proprioceptive awareness per normal-mild sway x 30 sec condition 4 M-CTSIB Baseline: moderate Goal status: INITIAL  4.  Pt to report symptoms not exceeding 2/10 during household activities and looking up Baseline: 4+/10 Goal status: INITIAL    ASSESSMENT:  CLINICAL IMPRESSION: Patient is a 38 y.o. male who was seen today for physical therapy evaluation and treatment for dizziness.  Pt experiencing symptoms of neck discomfort and dizziness w/ cervical extension. Symptomatic to vertical gaze direction > horizontal and the same was reproduced with VOR vertical vs horizontal.  No positional vertigo experienced and describes/demonstrates more motion sensitivity vs dizziness.  Moderate sway condition 4 M-CTSIB with difficulty in correcting.  He has also been experiencing painful neck movements affecting R > L and notes this may also be a contributing factor.  Initiated treatment for gentle habituation and provided instruction and review for proper sequence at home. Would benefit from ongoing PT services to address deficits and limitations.    OBJECTIVE IMPAIRMENTS: decreased activity tolerance, decreased balance, dizziness, and pain.   ACTIVITY LIMITATIONS: lifting, bending, transfers, reach over head, and locomotion level  PARTICIPATION LIMITATIONS: meal prep, cleaning, shopping, community activity, occupation, and yard work  PERSONAL FACTORS: Age, Time since onset of injury/illness/exacerbation, and 1 comorbidity: neck pain are also affecting patient's  functional outcome.   REHAB POTENTIAL: Good  CLINICAL DECISION MAKING: Stable/uncomplicated  EVALUATION COMPLEXITY: Moderate   PLAN:  PT FREQUENCY: 1x/week  PT DURATION: other: 5 weeks  PLANNED INTERVENTIONS: 97750- Physical Performance Testing, 97110-Therapeutic exercises, 97530- Therapeutic activity, [id number]- Neuromuscular re-education, 97535- Self Care, 16109- Manual therapy, [id number]- Gait training, [phone]- Canalith repositioning, [id number]- Aquatic Therapy, [id number]- Electrical stimulation (unattended), [id number]- Traction (mechanical), and 20560 (1-2 muscles), 20561 (3+ muscles)- Dry Needling  PLAN FOR NEXT SESSION: HEP review, FGA, cervicogenic dizziness?   3:26 PM, 09/03/23 M. Kelly Jayvan Mcshan, PT, DPT Physical Therapist- [place] Office Number: [phone]

## 2023-09-05 ENCOUNTER — Other Ambulatory Visit

## 2023-09-10 ENCOUNTER — Other Ambulatory Visit: Payer: Self-pay

## 2023-09-10 ENCOUNTER — Ambulatory Visit
Admission: RE | Admit: 2023-09-10 | Discharge: 2023-09-10 | Disposition: A | Discharge status: Home / Self Care | Source: Ambulatory Visit | Attending: Family Medicine | Admitting: Family Medicine

## 2023-09-10 ENCOUNTER — Other Ambulatory Visit (HOSPITAL_COMMUNITY): Payer: Self-pay

## 2023-09-10 DIAGNOSIS — M79602 Pain in left arm: Secondary | ICD-10-CM

## 2023-09-10 DIAGNOSIS — M5412 Radiculopathy, cervical region: Secondary | ICD-10-CM

## 2023-09-10 DIAGNOSIS — G540 Brachial plexus disorders: Secondary | ICD-10-CM

## 2023-09-10 MED ORDER — GADOPICLENOL 0.5 MMOL/ML IV SOLN: 10.0000 mL | Freq: Once | INTRAVENOUS | Status: DC | PRN

## 2023-09-11 ENCOUNTER — Ambulatory Visit

## 2023-09-11 ENCOUNTER — Other Ambulatory Visit (HOSPITAL_COMMUNITY): Payer: Self-pay

## 2023-09-11 ENCOUNTER — Encounter: Payer: Self-pay | Admitting: Family Medicine

## 2023-09-11 DIAGNOSIS — M542 Cervicalgia: Secondary | ICD-10-CM | POA: Diagnosis not present

## 2023-09-11 DIAGNOSIS — R42 Dizziness and giddiness: Secondary | ICD-10-CM

## 2023-09-11 MED ORDER — PANTOPRAZOLE SODIUM 40 MG PO TBEC
40.0000 mg | DELAYED_RELEASE_TABLET | Freq: Every day | ORAL | 3 refills | Status: DC
  Filled 2023-09-11 – 2023-09-13 (×2): qty 30, 30d supply, fill #0
  Filled 2023-10-21: qty 30, 30d supply, fill #1
  Filled 2023-10-30: qty 30, 30d supply, fill #2

## 2023-09-11 MED ORDER — LORAZEPAM 0.5 MG PO TABS
ORAL_TABLET | ORAL | 0 refills | Status: AC
  Filled 2023-09-11: qty 4, 1d supply, fill #0

## 2023-09-11 NOTE — Therapy (Signed)
 OUTPATIENT PHYSICAL THERAPY VESTIBULAR TREATMENT     Patient Name: Andre Perez MRN: [id number] DOB:[birth year], 38 y.o., male Today's Date: 09/11/2023  END OF SESSION:  PT End of Session - 09/11/23 1621     Visit Number 2    Number of Visits 5    Date for PT Re-Evaluation 10/08/23    Authorization Type BCBS    PT Start Time 1620    PT Stop Time 1700    PT Time Calculation (min) 40 min          Past Medical History:  Diagnosis Date   Anxiety    Back pain    GERD (gastroesophageal reflux disease)    Infertility male    Lactose intolerance    Obesity    Sleep apnea    Past Surgical History:  Procedure Laterality Date   TESTICLE REMOVAL     TESTICULAR PROSTHETIC INSERTION     Patient Active Problem List   Diagnosis Date Noted   Gastroesophageal reflux disease 04/19/2020   Gynecomastia 04/19/2020   Lipoprotein deficiency disorder 04/19/2020   Obstructive sleep apnea syndrome 04/19/2020   Vitamin D  deficiency 11/17/2019   Insulin  resistance 11/17/2019   Visceral obesity 11/17/2019   Depression 11/17/2019    PCP: Earlyne Motto, DO REFERRING PROVIDER: Joane Artist RAMAN, MD  REFERRING DIAG:  R42 (ICD-10-CM) - Dizziness    THERAPY DIAG:  Dizziness and giddiness  Cervicalgia  ONSET DATE: approx 1 year  Rationale for Evaluation and Treatment: Rehabilitation  SUBJECTIVE:   SUBJECTIVE STATEMENT: Have been waking up with HA and brain fog in the AM.  The dizziness/imbalance symptoms are still about the same. The neck has been feeling slightly better from not performing as much exertion/heavy labor, but the dizziness has persisted  Pt accompanied by: self  PERTINENT HISTORY:   PAIN:  Are you having pain? Yes: NPRS scale: 4/10 Pain location: middle-right of neck Pain description: sharp/ache Aggravating factors: lifting, sitting w/ poor posture Relieving factors: rest  PRECAUTIONS: None  RED FLAGS: None   WEIGHT BEARING RESTRICTIONS: No  FALLS:  Has patient fallen in last 6 months? No  LIVING ENVIRONMENT: Lives with: lives with their family Lives in: House/apartment Stairs:  Has following equipment at home: None  PLOF: Independent, works as Pensions consultant  PATIENT GOALS: address symptoms  OBJECTIVE:   TODAY'S TREATMENT: 09/11/23 Activity Comments  Joint Position Error Test Left rotation: average of 5 from bullseye Right rotation: avg 4 from bullseye Extension: avg 2-3 from bullseye  Functional Gait Assessment 30/30  Chin retraction against t-band resist 1x10              Note: Objective measures were completed at Evaluation unless otherwise noted.  DIAGNOSTIC FINDINGS: Cervical spine MRI November 2024  IMPRESSION: 1. Normal MRI appearance of the cervical spinal cord. No cord signal changes to suggest myelopathy. 2. Right paracentral disc protrusion with uncovertebral spurring at C4-5 with resultant mild spinal stenosis, with mild left C5 foraminal narrowing. 3. Additional mild noncompressive disc bulging at C2-3 through C7-T1 without significant stenosis or impingement  NCV & EMG Findings: Extensive electrodiagnostic testing of the left upper extremity shows:  Left median sensory response shows reduced amplitude (L8.1 V).  Left ulnar, radial, mixed palmar, medial antebrachial, and lateral antebrachial cutaneous sensory responses are within normal limits.  Left median and ulnar motor responses are within normal limits.  Chronic motor axonal loss changes are seen affecting the left pronator teres, flexor pollicis longus, and abductor pollicis brevis muscle, without  accompanied active denervation.   Impression: The electrophysiologic findings are consistent with a chronic left median neuropathy proximal to the branch point to the pronator teres muscle.  Overall, these findings are mild-to-moderate in degree electrically.  COGNITION: Overall cognitive status: Within functional limits for tasks  assessed     POSTURE:  No Significant postural limitations  Cervical ROM:    Active A/PROM (deg) eval  Flexion 40  Extension 50  Right lateral flexion 45  Left lateral flexion 30  Right rotation 60  Left rotation 70  (Blank rows = not tested) ---cervical extension provocative to symtoms: swimmyhead/queasy   STRENGTH:   LOWER EXTREMITY MMT:   MMT Right eval Left eval  Hip flexion    Hip abduction    Hip adduction    Hip internal rotation    Hip external rotation    Knee flexion    Knee extension    Ankle dorsiflexion    Ankle plantarflexion    Ankle inversion    Ankle eversion    (Blank rows = not tested)   GAIT: Gait pattern: WFL Distance walked:  Assistive device utilized: None Level of assistance: Complete Independence Comments:   FUNCTIONAL TESTS:  Functional gait assessment: TBD  M-CTSIB  Condition 1: Firm Surface, EO 30 Sec, Normal Sway  Condition 2: Firm Surface, EC 30 Sec, Normal Sway  Condition 3: Foam Surface, EO 30 Sec, Normal Sway  Condition 4: Foam Surface, EC 30 Sec, Moderate Sway    PATIENT SURVEYS:  DHI 32  VESTIBULAR ASSESSMENT:  GENERAL OBSERVATION: wears prescription glasses   SYMPTOM BEHAVIOR:  Subjective history: approx 1 year onset of dizziness/motion sensitivity  Non-Vestibular symptoms: neck pain  Type of dizziness: Imbalance (Disequilibrium), Unsteady with head/body turns, and Swimmyheaded  Frequency: daily/episodic  Duration: seconds/minutes  Aggravating factors: Induced by position change: lying supine, supine to sit, and sit to stand, Induced by motion: looking up at the ceiling, bending down to the ground, turning body quickly, and turning head quickly, and Worse in the dark  Relieving factors: slow movements and avoid busy/distracting environments  Progression of symptoms: unchanged  OCULOMOTOR EXAM:  Ocular Alignment: normal  Ocular ROM: No Limitations  Spontaneous Nystagmus: absent  Gaze-Induced Nystagmus:  absent--reports symptom provocation looking up  Smooth Pursuits: intact  Saccades: intact--vertical symptomatic  Convergence/Divergence: 6 cm --right eye loses fusion after 3 sec    VESTIBULAR - OCULAR REFLEX:   Slow VOR: Comment: horizontal < vertical for swimmy-headed  VOR Cancellation: Normal  Head-Impulse Test: HIT Right: negative HIT Left: negative  Dynamic Visual Acuity: Not able to be assessed   POSITIONAL TESTING: Right Dix-Hallpike: no nystagmus Left Dix-Hallpike: no nystagmus Right Roll Test: no nystagmus Left Roll Test: no nystagmus  MOTION SENSITIVITY:  Motion Sensitivity Quotient Intensity: 0 = none, 1 = Lightheaded, 2 = Mild, 3 = Moderate, 4 = Severe, 5 = Vomiting  Intensity  1. Sitting to supine   2. Supine to L side   3. Supine to R side   4. Supine to sitting   5. L Hallpike-Dix   6. Up from L    7. R Hallpike-Dix   8. Up from R    9. Sitting, head tipped to L knee   10. Head up from L knee   11. Sitting, head tipped to R knee   12. Head up from R knee   13. Sitting head turns x5   14.Sitting head nods x5   15. In stance, 180 turn to L  16. In stance, 180 turn to R     OTHOSTATICS: not done                                                                                                                               TREATMENT DATE: 09/03/23  See below for HEP development  PATIENT EDUCATION: Education details: assessment details, HEP initiation Person educated: Patient Education method: Explanation Education comprehension: verbalized understanding  HOME EXERCISE PROGRAM: Access Code: [email or web address] URL: [email or web address] Date: 09/03/2023 Prepared by: Burnard Sandifer  Exercises - Seated Gaze Stabilization with Head Rotation  - 1 x daily - 7 x weekly - 3-5 sets - 15-30 sec hold - Seated Gaze Stabilization with Head Nod  - 1 x daily - 7 x weekly - 3-5 sets - 30 sec hold - Corner Balance Feet Together With Eyes Closed  - 1 x daily  - 7 x weekly - 3 sets - 30 sec hold - Corner Balance Feet Together: Eyes Closed With Head Turns  - 1 x daily - 7 x weekly - 3 sets - 30 sec hold - Cervical Retraction with Resistance  - 1 x daily - 7 x weekly - 3 sets - 10 reps - 2-3 sec hold  GOALS: Goals reviewed with patient? Yes  SHORT TERM GOALS: Target date: same as LTG   LONG TERM GOALS: Target date: 10/08/2023    The patient will be independent with HEP for gaze adaptation, habituation, balance, and general mobility. Baseline:  Goal status: INITIAL  2.  FGA to be performed and goal constructed Baseline: TBD Goal status: INITIAL  3.  Demo improved postural stability/proprioceptive awareness per normal-mild sway x 30 sec condition 4 M-CTSIB Baseline: moderate Goal status: INITIAL  4.  Pt to report symptoms not exceeding 2/10 during household activities and looking up Baseline: 4+/10 Goal status: INITIAL    ASSESSMENT:  CLINICAL IMPRESSION: Performed Cervical Joint Position Error Testing with horizontal errors greater than 4.5 cm with improved accuracy for flexion/extension, (> 4.5 degrees (horizontal) indicates abnormal cervical proprioception.) but was not provoking for symptoms.  Functional Gait Assessment with score 30/30 and notes some disturbance with walking and maintaining head in extension.  Pt reports hx of past PT with focus on his affected LUE and neck and notes some improvement in pain and dysfunction when performing postural exercises. Provided HEP addition for chin retraction against red t-band resistance for improved endurance to postural correction.  Continued sessions to address neck pain and dizziness.   OBJECTIVE IMPAIRMENTS: decreased activity tolerance, decreased balance, dizziness, and pain.   ACTIVITY LIMITATIONS: lifting, bending, transfers, reach over head, and locomotion level  PARTICIPATION LIMITATIONS: meal prep, cleaning, shopping, community activity, occupation, and yard work  PERSONAL  FACTORS: Age, Time since onset of injury/illness/exacerbation, and 1 comorbidity: neck pain are also affecting patient's functional outcome.   REHAB POTENTIAL: Good  CLINICAL DECISION MAKING: Stable/uncomplicated  EVALUATION COMPLEXITY: Moderate   PLAN:  PT FREQUENCY: 1x/week  PT DURATION: other: 5 weeks  PLANNED INTERVENTIONS: 97750- Physical Performance Testing, 97110-Therapeutic exercises, 97530- Therapeutic activity, [id number]- Neuromuscular re-education, 97535- Self Care, 02859- Manual therapy, [id number]- Gait training, [phone]- Canalith repositioning, [id number]- Aquatic Therapy, [phone]- Electrical stimulation (unattended), [id number]- Traction (mechanical), and 20560 (1-2 muscles), 20561 (3+ muscles)- Dry Needling  PLAN FOR NEXT SESSION: cervical spine manual and repeat balance testing to see if this helps?, VOR at speed?   4:21 PM, 09/11/23 M. Kelly Carmalita Wakefield, PT, DPT Physical Therapist- [place] Office Number: [phone]

## 2023-09-13 ENCOUNTER — Other Ambulatory Visit (HOSPITAL_COMMUNITY): Payer: Self-pay

## 2023-09-17 ENCOUNTER — Inpatient Hospital Stay: Admission: RE | Admit: 2023-09-17 | Source: Ambulatory Visit

## 2023-09-18 ENCOUNTER — Ambulatory Visit
Admission: RE | Admit: 2023-09-18 | Discharge: 2023-09-18 | Disposition: A | Discharge status: Home / Self Care | Source: Ambulatory Visit | Attending: Family Medicine | Admitting: Family Medicine

## 2023-09-18 ENCOUNTER — Other Ambulatory Visit (HOSPITAL_COMMUNITY): Payer: Self-pay

## 2023-09-18 DIAGNOSIS — R202 Paresthesia of skin: Secondary | ICD-10-CM | POA: Diagnosis not present

## 2023-09-18 DIAGNOSIS — M79602 Pain in left arm: Secondary | ICD-10-CM | POA: Diagnosis not present

## 2023-09-19 ENCOUNTER — Encounter: Payer: Self-pay | Admitting: Physical Therapy

## 2023-09-19 ENCOUNTER — Ambulatory Visit: Attending: Family Medicine | Admitting: Physical Therapy

## 2023-09-19 ENCOUNTER — Ambulatory Visit: Admitting: Physical Therapy

## 2023-09-19 DIAGNOSIS — R42 Dizziness and giddiness: Secondary | ICD-10-CM | POA: Insufficient documentation

## 2023-09-19 DIAGNOSIS — M542 Cervicalgia: Secondary | ICD-10-CM | POA: Diagnosis not present

## 2023-09-19 NOTE — Therapy (Signed)
 OUTPATIENT PHYSICAL THERAPY VESTIBULAR TREATMENT     Patient Name: Andre Perez MRN: [id number] DOB:Jan 19, 1986, 38 y.o., male Today's Date: 09/19/2023  END OF SESSION:  PT End of Session - 09/19/23 0805     Visit Number 3    Number of Visits 5    Date for PT Re-Evaluation 10/08/23    Authorization Type BCBS    PT Start Time 0805    PT Stop Time 0845    PT Time Calculation (min) 40 min    Activity Tolerance Patient tolerated treatment well    Behavior During Therapy WFL for tasks assessed/performed           Past Medical History:  Diagnosis Date   Anxiety    Back pain    GERD (gastroesophageal reflux disease)    Infertility male    Lactose intolerance    Obesity    Sleep apnea    Past Surgical History:  Procedure Laterality Date   TESTICLE REMOVAL     TESTICULAR PROSTHETIC INSERTION     Patient Active Problem List   Diagnosis Date Noted   Gastroesophageal reflux disease 04/19/2020   Gynecomastia 04/19/2020   Lipoprotein deficiency disorder 04/19/2020   Obstructive sleep apnea syndrome 04/19/2020   Vitamin D  deficiency 11/17/2019   Insulin  resistance 11/17/2019   Visceral obesity 11/17/2019   Depression 11/17/2019    PCP: Earlyne Motto, DO REFERRING PROVIDER: Joane Artist RAMAN, MD  REFERRING DIAG:  R42 (ICD-10-CM) - Dizziness    THERAPY DIAG:  Dizziness and giddiness  Cervicalgia  ONSET DATE: approx 1 year  Rationale for Evaluation and Treatment: Rehabilitation  SUBJECTIVE:   SUBJECTIVE STATEMENT: Have been feeling better over the past week.  The dizziness is still more of a swimmy-headedness with rating as 1-2/10.  The GLP medicine may be something that contributes.  Neck pain is better Pt accompanied by: self  PERTINENT HISTORY:   PAIN:  Are you having pain? Yes: NPRS scale: 2/10 Pain location: middle-right of neck Pain description: sharp/ache Aggravating factors: lifting, sitting w/ poor posture Relieving factors:  rest  PRECAUTIONS: None  RED FLAGS: None   WEIGHT BEARING RESTRICTIONS: No  FALLS: Has patient fallen in last 6 months? No  LIVING ENVIRONMENT: Lives with: lives with their family Lives in: House/apartment Stairs:  Has following equipment at home: None  PLOF: Independent, works as Pensions consultant  PATIENT GOALS: address symptoms  OBJECTIVE:    TODAY'S TREATMENT: 09/19/2023 Activity Comments  SNAG neck extension with towel, 3 x 15 sec Reports tightness, but feels good  Neck rotation SNAG with towel, 3 x 15 sec each side No pain  MSQ- See below  Corner balance-Romberg with EC + head turns/nods   Tandem stance 2 x 30 sec with EO/EC nausea  Gait 50 ft x 8 reps To try to settle symptoms    MOTION SENSITIVITY:  Motion Sensitivity Quotient Intensity: 0 = none, 1 = Lightheaded, 2 = Mild, 3 = Moderate, 4 = Severe, 5 = Vomiting  Intensity  1. Sitting to supine 1  2. Supine to L side 0  3. Supine to R side 0  4. Supine to sitting 2  5. L Hallpike-Dix   6. Up from L    7. R Hallpike-Dix   8. Up from R    9. Sitting, head tipped to L knee 0-1  10. Head up from L knee 1-2  11. Sitting, head tipped to R knee 0  12. Head up from R knee 1-2  13.  Sitting head turns x5 3, subsides at rest  14.Sitting head nods x5 2  15. In stance, 180 turn to L  1  16. In stance, 180 turn to R 1-2    PATIENT EDUCATION: Education details: Symptom provocation levels with HEP (gaze stabilization) and principles of habituation-make sure to repeat gaze stabilization ex 3 sets and let symptoms settle between Person educated: Patient Education method: Explanation and Demonstration Education comprehension: verbalized understanding and returned demonstration   Note: Objective measures were completed at Evaluation unless otherwise noted.  DIAGNOSTIC FINDINGS: Cervical spine MRI November 2024  IMPRESSION: 1. Normal MRI appearance of the cervical spinal cord. No cord signal changes to suggest  myelopathy. 2. Right paracentral disc protrusion with uncovertebral spurring at C4-5 with resultant mild spinal stenosis, with mild left C5 foraminal narrowing. 3. Additional mild noncompressive disc bulging at C2-3 through C7-T1 without significant stenosis or impingement  NCV & EMG Findings: Extensive electrodiagnostic testing of the left upper extremity shows:  Left median sensory response shows reduced amplitude (L8.1 V).  Left ulnar, radial, mixed palmar, medial antebrachial, and lateral antebrachial cutaneous sensory responses are within normal limits.  Left median and ulnar motor responses are within normal limits.  Chronic motor axonal loss changes are seen affecting the left pronator teres, flexor pollicis longus, and abductor pollicis brevis muscle, without accompanied active denervation.   Impression: The electrophysiologic findings are consistent with a chronic left median neuropathy proximal to the branch point to the pronator teres muscle.  Overall, these findings are mild-to-moderate in degree electrically.  COGNITION: Overall cognitive status: Within functional limits for tasks assessed     POSTURE:  No Significant postural limitations  Cervical ROM:    Active A/PROM (deg) eval  Flexion 40  Extension 50  Right lateral flexion 45  Left lateral flexion 30  Right rotation 60  Left rotation 70  (Blank rows = not tested) ---cervical extension provocative to symtoms: swimmyhead/queasy   STRENGTH:   LOWER EXTREMITY MMT:   MMT Right eval Left eval  Hip flexion    Hip abduction    Hip adduction    Hip internal rotation    Hip external rotation    Knee flexion    Knee extension    Ankle dorsiflexion    Ankle plantarflexion    Ankle inversion    Ankle eversion    (Blank rows = not tested)   GAIT: Gait pattern: WFL Distance walked:  Assistive device utilized: None Level of assistance: Complete Independence Comments:   FUNCTIONAL TESTS:   Functional gait assessment: TBD  M-CTSIB  Condition 1: Firm Surface, EO 30 Sec, Normal Sway  Condition 2: Firm Surface, EC 30 Sec, Normal Sway  Condition 3: Foam Surface, EO 30 Sec, Normal Sway  Condition 4: Foam Surface, EC 30 Sec, Moderate Sway    PATIENT SURVEYS:  DHI 32  VESTIBULAR ASSESSMENT:  GENERAL OBSERVATION: wears prescription glasses   SYMPTOM BEHAVIOR:  Subjective history: approx 1 year onset of dizziness/motion sensitivity  Non-Vestibular symptoms: neck pain  Type of dizziness: Imbalance (Disequilibrium), Unsteady with head/body turns, and Swimmyheaded  Frequency: daily/episodic  Duration: seconds/minutes  Aggravating factors: Induced by position change: lying supine, supine to sit, and sit to stand, Induced by motion: looking up at the ceiling, bending down to the ground, turning body quickly, and turning head quickly, and Worse in the dark  Relieving factors: slow movements and avoid busy/distracting environments  Progression of symptoms: unchanged  OCULOMOTOR EXAM:  Ocular Alignment: normal  Ocular ROM:  No Limitations  Spontaneous Nystagmus: absent  Gaze-Induced Nystagmus: absent--reports symptom provocation looking up  Smooth Pursuits: intact  Saccades: intact--vertical symptomatic  Convergence/Divergence: 6 cm --right eye loses fusion after 3 sec    VESTIBULAR - OCULAR REFLEX:   Slow VOR: Comment: horizontal < vertical for swimmy-headed  VOR Cancellation: Normal  Head-Impulse Test: HIT Right: negative HIT Left: negative  Dynamic Visual Acuity: Not able to be assessed   POSITIONAL TESTING: Right Dix-Hallpike: no nystagmus Left Dix-Hallpike: no nystagmus Right Roll Test: no nystagmus Left Roll Test: no nystagmus  MOTION SENSITIVITY:  Motion Sensitivity Quotient Intensity: 0 = none, 1 = Lightheaded, 2 = Mild, 3 = Moderate, 4 = Severe, 5 = Vomiting  Intensity  1. Sitting to supine   2. Supine to L side   3. Supine to R side   4. Supine to  sitting   5. L Hallpike-Dix   6. Up from L    7. R Hallpike-Dix   8. Up from R    9. Sitting, head tipped to L knee   10. Head up from L knee   11. Sitting, head tipped to R knee   12. Head up from R knee   13. Sitting head turns x5   14.Sitting head nods x5   15. In stance, 180 turn to L    16. In stance, 180 turn to R     OTHOSTATICS: not done                                                                                                                               TREATMENT DATE: 09/03/23  See below for HEP development  PATIENT EDUCATION: Education details: assessment details, HEP initiation Person educated: Patient Education method: Explanation Education comprehension: verbalized understanding  HOME EXERCISE PROGRAM: Access Code: [email or web address] URL: [email or web address] Date: 09/03/2023 Prepared by: Burnard Sandifer  Exercises - Seated Gaze Stabilization with Head Rotation  - 1 x daily - 7 x weekly - 3-5 sets - 15-30 sec hold - Seated Gaze Stabilization with Head Nod  - 1 x daily - 7 x weekly - 3-5 sets - 30 sec hold - Corner Balance Feet Together With Eyes Closed  - 1 x daily - 7 x weekly - 3 sets - 30 sec hold - Corner Balance Feet Together: Eyes Closed With Head Turns  - 1 x daily - 7 x weekly - 3 sets - 30 sec hold - Cervical Retraction with Resistance  - 1 x daily - 7 x weekly - 3 sets - 10 reps - 2-3 sec hold  GOALS: Goals reviewed with patient? Yes  SHORT TERM GOALS: Target date: same as LTG   LONG TERM GOALS: Target date: 10/08/2023    The patient will be independent with HEP for gaze adaptation, habituation, balance, and general mobility. Baseline:  Goal status: INITIAL  2.  FGA to be performed and goal constructed Baseline: TBD Goal status: INITIAL  3.  Demo improved postural stability/proprioceptive awareness per normal-mild sway x 30 sec condition 4 M-CTSIB Baseline: moderate Goal status: INITIAL  4.  Pt to report symptoms not  exceeding 2/10 during household activities and looking up Baseline: 4+/10 Goal status: INITIAL    ASSESSMENT:  CLINICAL IMPRESSION: Pt presents today with continued reports of dizziness, which has been somewhat lessened over the past week.  Skilled PT session focused on review of HEP, with cues for optimal performance of gaze stabilization exercises.  Looked to progress corner balance, with symptoms of nausea brought on by end of session.  With MSQ, he has mild/mod symptoms with multiple activities, that settles to baseline; however, by end of session, pt's symptoms have increased to level of nausea (and he reports this is like his motion sickness he had as a child).  With corner balance ex, he tends to lean to L.  He will continue to benefit from skilled PT towards goals for improved functional mobility and decreased dizziness.   OBJECTIVE IMPAIRMENTS: decreased activity tolerance, decreased balance, dizziness, and pain.   ACTIVITY LIMITATIONS: lifting, bending, transfers, reach over head, and locomotion level  PARTICIPATION LIMITATIONS: meal prep, cleaning, shopping, community activity, occupation, and yard work  PERSONAL FACTORS: Age, Time since onset of injury/illness/exacerbation, and 1 comorbidity: neck pain are also affecting patient's functional outcome.   REHAB POTENTIAL: Good  CLINICAL DECISION MAKING: Stable/uncomplicated  EVALUATION COMPLEXITY: Moderate   PLAN:  PT FREQUENCY: 1x/week  PT DURATION: other: 5 weeks  PLANNED INTERVENTIONS: 97750- Physical Performance Testing, 97110-Therapeutic exercises, 97530- Therapeutic activity, [id number]- Neuromuscular re-education, 97535- Self Care, 02859- Manual therapy, [id number]- Gait training, [phone]- Canalith repositioning, [id number]- Aquatic Therapy, H9716- Electrical stimulation (unattended), [id number]- Traction (mechanical), and 20560 (1-2 muscles), 20561 (3+ muscles)- Dry Needling  PLAN FOR NEXT SESSION: Continue to progress VOR and  habituation exercises as able; cervical spine manual and repeat balance testing to see if this helps?, VOR at speed?   Greig Anon, PT 09/19/23 8:48 AM Phone: [phone] Fax: [phone]  [facility] [address] [place], [address] [place], [place] [address] Phone # [phone] Fax # [phone]

## 2023-09-20 ENCOUNTER — Ambulatory Visit: Payer: Self-pay | Admitting: Family Medicine

## 2023-09-20 ENCOUNTER — Other Ambulatory Visit (HOSPITAL_COMMUNITY): Payer: Self-pay

## 2023-09-20 MED ORDER — ZEPBOUND 15 MG/0.5ML ~~LOC~~ SOAJ
15.0000 mg | SUBCUTANEOUS | 5 refills | Status: DC
  Filled 2023-09-20: qty 2, 28d supply, fill #0

## 2023-09-20 NOTE — Progress Notes (Signed)
 The brachial plexus MRI does not show us  what the problem is.  That does not mean the brachial plexus was not injured or bruised when you broke your collarbone but we do not have a clear explanation for your symptoms based on this MRI.  You do have a bulging disc in the right of the cervical spine that could be causing some symptoms. I recommend returning to clinic to go over the results of full detail and discuss treatment plan and options from here.

## 2023-09-20 NOTE — Therapy (Signed)
 OUTPATIENT PHYSICAL THERAPY VESTIBULAR TREATMENT     Patient Name: Andre Perez MRN: [id number] DOB:1986-02-20, 38 y.o., male Today's Date: 09/24/2023  END OF SESSION:  PT End of Session - 09/24/23 1657     Visit Number 4    Number of Visits 5    Date for PT Re-Evaluation 10/08/23    Authorization Type BC[employer]    PT Start Time 1616    PT Stop Time 1654    PT Time Calculation (min) 38 min    Activity Tolerance Patient tolerated treatment well    Behavior During Therapy WFL for tasks assessed/performed            Past Medical History:  Diagnosis Date   Anxiety    Back pain    GERD (gastroesophageal reflux disease)    Infertility male    Lactose intolerance    Obesity    Sleep apnea    Past Surgical History:  Procedure Laterality Date   TESTICLE REMOVAL     TESTICULAR PROSTHETIC INSERTION     Patient Active Problem List   Diagnosis Date Noted   Gastroesophageal reflux disease 04/19/2020   Gynecomastia 04/19/2020   Lipoprotein deficiency disorder 04/19/2020   Obstructive sleep apnea syndrome 04/19/2020   Vitamin D  deficiency 11/17/2019   Insulin  resistance 11/17/2019   Visceral obesity 11/17/2019   Depression 11/17/2019    PCP: Earlyne Motto, DO REFERRING PROVIDER: Joane Artist RAMAN, MD  REFERRING DIAG:  R42 (ICD-10-CM) - Dizziness    THERAPY DIAG:  Dizziness and giddiness  Cervicalgia  ONSET DATE: approx 1 year  Rationale for Evaluation and Treatment: Rehabilitation  SUBJECTIVE:   SUBJECTIVE STATEMENT: Overall better. Stopped taking Gabapentin and started cutting down on Trazodone . Reports some very slight dizziness- swimmyheaded when driving but the brain fog is better. Reports scrolling on phone makes him dizzy, repeated stopping and going of driving, switching between monitors. Reports hx of motion sickness as a kid. Denies migraines.     Pt accompanied by: self  PERTINENT HISTORY:   PAIN:  Are you having pain? Yes: NPRS scale:  mild/10 Pain location: tightness in R shoulder and pain in L hand  Pain description: sharp/ache Aggravating factors: lifting, sitting w/ poor posture Relieving factors: rest  PRECAUTIONS: None  RED FLAGS: None   WEIGHT BEARING RESTRICTIONS: No  FALLS: Has patient fallen in last 6 months? No  LIVING ENVIRONMENT: Lives with: lives with their family Lives in: House/apartment Stairs:  Has following equipment at home: None  PLOF: Independent, works as Pensions consultant  PATIENT GOALS: address symptoms  OBJECTIVE:     TODAY'S TREATMENT: 09/24/23 Activity Comments  Standing VOR cancellation horizontal and vertical 30 C/o 6/10 swimmyheadedness in B directions. Reported some neck pain with vertical movements   Standing head turns to targets in front of blinds 30 Report 6/10 dizziness. Cueing to reduce speed for improved tolerance (4/10)    HOME EXERCISE PROGRAM Last updated: 09/24/23  Access Code: [email or web address] URL: [email or web address] Date: 09/24/2023 Prepared by: [facility] - Outpatient  Rehab - [facility]  Exercises - Seated Gaze Stabilization with Head Rotation  - 1 x daily - 7 x weekly - 3-5 sets - 15-30 sec hold - Seated Gaze Stabilization with Head Nod  - 1 x daily - 7 x weekly - 3-5 sets - 30 sec hold - Corner Balance Feet Together With Eyes Closed  - 1 x daily - 7 x weekly - 3 sets - 30 sec hold - Corner Balance Feet Together: Eyes Closed  With Head Turns  - 1 x daily - 7 x weekly - 3 sets - 30 sec hold - Cervical Retraction with Resistance  - 1 x daily - 7 x weekly - 3 sets - 10 reps - 2-3 sec hold - Standing with Head Rotation  - 1 x daily - 5 x weekly - 2-3 sets - 30 sec hold - Standing VOR Cancellation  - 1 x daily - 5 x weekly - 2-3 sets - 30 sec hold   PATIENT EDUCATION: Education details: discussion on pt's remaining aggravating factors, answered pt's questions on how change in glasses Rx can impact underlying dizziness Person educated:  Patient Education method: Explanation, Demonstration, Tactile cues, Verbal cues, and Handouts Education comprehension: verbalized understanding and returned demonstration       Note: Objective measures were completed at Evaluation unless otherwise noted.  DIAGNOSTIC FINDINGS: Cervical spine MRI November 2024  IMPRESSION: 1. Normal MRI appearance of the cervical spinal cord. No cord signal changes to suggest myelopathy. 2. Right paracentral disc protrusion with uncovertebral spurring at C4-5 with resultant mild spinal stenosis, with mild left C5 foraminal narrowing. 3. Additional mild noncompressive disc bulging at C2-3 through C7-T1 without significant stenosis or impingement  NCV & EMG Findings: Extensive electrodiagnostic testing of the left upper extremity shows:  Left median sensory response shows reduced amplitude (L8.1 V).  Left ulnar, radial, mixed palmar, medial antebrachial, and lateral antebrachial cutaneous sensory responses are within normal limits.  Left median and ulnar motor responses are within normal limits.  Chronic motor axonal loss changes are seen affecting the left pronator teres, flexor pollicis longus, and abductor pollicis brevis muscle, without accompanied active denervation.   Impression: The electrophysiologic findings are consistent with a chronic left median neuropathy proximal to the branch point to the pronator teres muscle.  Overall, these findings are mild-to-moderate in degree electrically.  COGNITION: Overall cognitive status: Within functional limits for tasks assessed     POSTURE:  No Significant postural limitations  Cervical ROM:    Active A/PROM (deg) eval  Flexion 40  Extension 50  Right lateral flexion 45  Left lateral flexion 30  Right rotation 60  Left rotation 70  (Blank rows = not tested) ---cervical extension provocative to symtoms: swimmyhead/queasy   STRENGTH:   LOWER EXTREMITY MMT:   MMT Right eval  Left eval  Hip flexion    Hip abduction    Hip adduction    Hip internal rotation    Hip external rotation    Knee flexion    Knee extension    Ankle dorsiflexion    Ankle plantarflexion    Ankle inversion    Ankle eversion    (Blank rows = not tested)   GAIT: Gait pattern: WFL Distance walked:  Assistive device utilized: None Level of assistance: Complete Independence Comments:   FUNCTIONAL TESTS:  Functional gait assessment: TBD  M-CTSIB  Condition 1: Firm Surface, EO 30 Sec, Normal Sway  Condition 2: Firm Surface, EC 30 Sec, Normal Sway  Condition 3: Foam Surface, EO 30 Sec, Normal Sway  Condition 4: Foam Surface, EC 30 Sec, Moderate Sway    PATIENT SURVEYS:  DHI 32  VESTIBULAR ASSESSMENT:  GENERAL OBSERVATION: wears prescription glasses   SYMPTOM BEHAVIOR:  Subjective history: approx 1 year onset of dizziness/motion sensitivity  Non-Vestibular symptoms: neck pain  Type of dizziness: Imbalance (Disequilibrium), Unsteady with head/body turns, and Swimmyheaded  Frequency: daily/episodic  Duration: seconds/minutes  Aggravating factors: Induced by position change: lying supine, supine to  sit, and sit to stand, Induced by motion: looking up at the ceiling, bending down to the ground, turning body quickly, and turning head quickly, and Worse in the dark  Relieving factors: slow movements and avoid busy/distracting environments  Progression of symptoms: unchanged  OCULOMOTOR EXAM:  Ocular Alignment: normal  Ocular ROM: No Limitations  Spontaneous Nystagmus: absent  Gaze-Induced Nystagmus: absent--reports symptom provocation looking up  Smooth Pursuits: intact  Saccades: intact--vertical symptomatic  Convergence/Divergence: 6 cm --right eye loses fusion after 3 sec    VESTIBULAR - OCULAR REFLEX:   Slow VOR: Comment: horizontal < vertical for swimmy-headed  VOR Cancellation: Normal  Head-Impulse Test: HIT Right: negative HIT Left: negative  Dynamic Visual  Acuity: Not able to be assessed   POSITIONAL TESTING: Right Dix-Hallpike: no nystagmus Left Dix-Hallpike: no nystagmus Right Roll Test: no nystagmus Left Roll Test: no nystagmus  MOTION SENSITIVITY:  Motion Sensitivity Quotient Intensity: 0 = none, 1 = Lightheaded, 2 = Mild, 3 = Moderate, 4 = Severe, 5 = Vomiting  Intensity  1. Sitting to supine   2. Supine to L side   3. Supine to R side   4. Supine to sitting   5. L Hallpike-Dix   6. Up from L    7. R Hallpike-Dix   8. Up from R    9. Sitting, head tipped to L knee   10. Head up from L knee   11. Sitting, head tipped to R knee   12. Head up from R knee   13. Sitting head turns x5   14.Sitting head nods x5   15. In stance, 180 turn to L    16. In stance, 180 turn to R     OTHOSTATICS: not done                                                                                                                               TREATMENT DATE: 09/03/23  See below for HEP development  PATIENT EDUCATION: Education details: assessment details, HEP initiation Person educated: Patient Education method: Explanation Education comprehension: verbalized understanding  HOME EXERCISE PROGRAM: Access Code: [email or web address] URL: [email or web address] Date: 09/03/2023 Prepared by: Burnard Sandifer  Exercises - Seated Gaze Stabilization with Head Rotation  - 1 x daily - 7 x weekly - 3-5 sets - 15-30 sec hold - Seated Gaze Stabilization with Head Nod  - 1 x daily - 7 x weekly - 3-5 sets - 30 sec hold - Corner Balance Feet Together With Eyes Closed  - 1 x daily - 7 x weekly - 3 sets - 30 sec hold - Corner Balance Feet Together: Eyes Closed With Head Turns  - 1 x daily - 7 x weekly - 3 sets - 30 sec hold - Cervical Retraction with Resistance  - 1 x daily - 7 x weekly - 3 sets - 10 reps - 2-3 sec hold   GOALS: Goals reviewed with patient? Yes  SHORT  TERM GOALS: Target date: same as LTG   LONG TERM GOALS: Target date: 10/08/2023     The patient will be independent with HEP for gaze adaptation, habituation, balance, and general mobility. Baseline:  Goal status: INITIAL  2.  FGA to be performed and goal constructed Baseline: TBD Goal status: INITIAL  3.  Demo improved postural stability/proprioceptive awareness per normal-mild sway x 30 sec condition 4 M-CTSIB Baseline: moderate Goal status: INITIAL  4.  Pt to report symptoms not exceeding 2/10 during household activities and looking up Baseline: 4+/10 Goal status: INITIAL    ASSESSMENT:  CLINICAL IMPRESSION: Patient arrived to session with report of improvement in dizziness since stopping a few meds. Upon discussion, pt reports hx of motion sickness as a child and reports some visual motion sensitivity with tasks like using the phone, computer, or driving. Trialed addition of optokinetic challenges which were aggravating to pt's sx. Adjusted speed to improve tolerance. patient reported understanding of HEP update and without complaints upon leaving.   OBJECTIVE IMPAIRMENTS: decreased activity tolerance, decreased balance, dizziness, and pain.   ACTIVITY LIMITATIONS: lifting, bending, transfers, reach over head, and locomotion level  PARTICIPATION LIMITATIONS: meal prep, cleaning, shopping, community activity, occupation, and yard work  PERSONAL FACTORS: Age, Time since onset of injury/illness/exacerbation, and 1 comorbidity: neck pain are also affecting patient's functional outcome.   REHAB POTENTIAL: Good  CLINICAL DECISION MAKING: Stable/uncomplicated  EVALUATION COMPLEXITY: Moderate   PLAN:  PT FREQUENCY: 1x/week  PT DURATION: other: 5 weeks  PLANNED INTERVENTIONS: 97750- Physical Performance Testing, 97110-Therapeutic exercises, 97530- Therapeutic activity, [id number]- Neuromuscular re-education, 97535- Self Care, 02859- Manual therapy, [id number]- Gait training, [phone]- Canalith repositioning, [id number]- Aquatic Therapy, H9716- Electrical stimulation  (unattended), [id number]- Traction (mechanical), and 20560 (1-2 muscles), 20561 (3+ muscles)- Dry Needling  PLAN FOR NEXT SESSION: optokinetic challenges, Continue to progress VOR and habituation exercises as able; cervical spine manual and repeat balance testing to see if this helps?, VOR at speed?   Louana Terrilyn Christians, PT, DPT 09/24/23 4:58 PM  [facility] at [facility] [address] [place], [address] [place], [place] [address] Phone # [phone] Fax # [phone]

## 2023-09-24 ENCOUNTER — Encounter: Payer: Self-pay | Admitting: Physical Therapy

## 2023-09-24 ENCOUNTER — Ambulatory Visit: Admitting: Physical Therapy

## 2023-09-24 DIAGNOSIS — R42 Dizziness and giddiness: Secondary | ICD-10-CM

## 2023-09-24 DIAGNOSIS — M542 Cervicalgia: Secondary | ICD-10-CM | POA: Diagnosis not present

## 2023-09-28 NOTE — Therapy (Signed)
 OUTPATIENT PHYSICAL THERAPY VESTIBULAR TREATMENT     Patient Name: Andre Perez MRN: [id number] DOB:[birth year], 38 y.o., male Today's Date: 09/28/2023  END OF SESSION:      Past Medical History:  Diagnosis Date   Anxiety    Back pain    GERD (gastroesophageal reflux disease)    Infertility male    Lactose intolerance    Obesity    Sleep apnea    Past Surgical History:  Procedure Laterality Date   TESTICLE REMOVAL     TESTICULAR PROSTHETIC INSERTION     Patient Active Problem List   Diagnosis Date Noted   Gastroesophageal reflux disease 04/19/2020   Gynecomastia 04/19/2020   Lipoprotein deficiency disorder 04/19/2020   Obstructive sleep apnea syndrome 04/19/2020   Vitamin D  deficiency 11/17/2019   Insulin  resistance 11/17/2019   Visceral obesity 11/17/2019   Depression 11/17/2019    PCP: Earlyne Motto, DO REFERRING PROVIDER: Joane Artist RAMAN, MD  REFERRING DIAG:  R42 (ICD-10-CM) - Dizziness    THERAPY DIAG:  No diagnosis found.  ONSET DATE: approx 1 year  Rationale for Evaluation and Treatment: Rehabilitation  SUBJECTIVE:   SUBJECTIVE STATEMENT: Overall better. Stopped taking Gabapentin and started cutting down on Trazodone . Reports some very slight dizziness- swimmyheaded when driving but the brain fog is better. Reports scrolling on phone makes him dizzy, repeated stopping and going of driving, switching between monitors. Reports hx of motion sickness as a kid. Denies migraines.     Pt accompanied by: self  PERTINENT HISTORY:   PAIN:  Are you having pain? Yes: NPRS scale: mild/10 Pain location: tightness in R shoulder and pain in L hand  Pain description: sharp/ache Aggravating factors: lifting, sitting w/ poor posture Relieving factors: rest  PRECAUTIONS: None  RED FLAGS: None   WEIGHT BEARING RESTRICTIONS: No  FALLS: Has patient fallen in last 6 months? No  LIVING ENVIRONMENT: Lives with: lives with their family Lives in:  House/apartment Stairs:  Has following equipment at home: None  PLOF: Independent, works as Pensions consultant  PATIENT GOALS: address symptoms  OBJECTIVE:     TODAY'S TREATMENT: 10/01/23 Activity Comments                        TODAY'S TREATMENT: 09/24/23 Activity Comments  Standing VOR cancellation horizontal and vertical 30 C/o 6/10 swimmyheadedness in B directions. Reported some neck pain with vertical movements   Standing head turns to targets in front of blinds 30 Report 6/10 dizziness. Cueing to reduce speed for improved tolerance (4/10)    HOME EXERCISE PROGRAM Last updated: 09/24/23  Access Code: [email or web address] URL: [email or web address] Date: 09/24/2023 Prepared by: [facility] - Outpatient  Rehab - [facility]  Exercises - Seated Gaze Stabilization with Head Rotation  - 1 x daily - 7 x weekly - 3-5 sets - 15-30 sec hold - Seated Gaze Stabilization with Head Nod  - 1 x daily - 7 x weekly - 3-5 sets - 30 sec hold - Corner Balance Feet Together With Eyes Closed  - 1 x daily - 7 x weekly - 3 sets - 30 sec hold - Corner Balance Feet Together: Eyes Closed With Head Turns  - 1 x daily - 7 x weekly - 3 sets - 30 sec hold - Cervical Retraction with Resistance  - 1 x daily - 7 x weekly - 3 sets - 10 reps - 2-3 sec hold - Standing with Head Rotation  - 1 x daily - 5 x weekly -  2-3 sets - 30 sec hold - Standing VOR Cancellation  - 1 x daily - 5 x weekly - 2-3 sets - 30 sec hold   PATIENT EDUCATION: Education details: discussion on pt's remaining aggravating factors, answered pt's questions on how change in glasses Rx can impact underlying dizziness Person educated: Patient Education method: Explanation, Demonstration, Tactile cues, Verbal cues, and Handouts Education comprehension: verbalized understanding and returned demonstration       Note: Objective measures were completed at Evaluation unless otherwise noted.  DIAGNOSTIC FINDINGS: Cervical spine MRI  November 2024  IMPRESSION: 1. Normal MRI appearance of the cervical spinal cord. No cord signal changes to suggest myelopathy. 2. Right paracentral disc protrusion with uncovertebral spurring at C4-5 with resultant mild spinal stenosis, with mild left C5 foraminal narrowing. 3. Additional mild noncompressive disc bulging at C2-3 through C7-T1 without significant stenosis or impingement  NCV & EMG Findings: Extensive electrodiagnostic testing of the left upper extremity shows:  Left median sensory response shows reduced amplitude (L8.1 V).  Left ulnar, radial, mixed palmar, medial antebrachial, and lateral antebrachial cutaneous sensory responses are within normal limits.  Left median and ulnar motor responses are within normal limits.  Chronic motor axonal loss changes are seen affecting the left pronator teres, flexor pollicis longus, and abductor pollicis brevis muscle, without accompanied active denervation.   Impression: The electrophysiologic findings are consistent with a chronic left median neuropathy proximal to the branch point to the pronator teres muscle.  Overall, these findings are mild-to-moderate in degree electrically.  COGNITION: Overall cognitive status: Within functional limits for tasks assessed     POSTURE:  No Significant postural limitations  Cervical ROM:    Active A/PROM (deg) eval  Flexion 40  Extension 50  Right lateral flexion 45  Left lateral flexion 30  Right rotation 60  Left rotation 70  (Blank rows = not tested) ---cervical extension provocative to symtoms: swimmyhead/queasy   STRENGTH:   LOWER EXTREMITY MMT:   MMT Right eval Left eval  Hip flexion    Hip abduction    Hip adduction    Hip internal rotation    Hip external rotation    Knee flexion    Knee extension    Ankle dorsiflexion    Ankle plantarflexion    Ankle inversion    Ankle eversion    (Blank rows = not tested)   GAIT: Gait pattern: WFL Distance walked:   Assistive device utilized: None Level of assistance: Complete Independence Comments:   FUNCTIONAL TESTS:  Functional gait assessment: TBD  M-CTSIB  Condition 1: Firm Surface, EO 30 Sec, Normal Sway  Condition 2: Firm Surface, EC 30 Sec, Normal Sway  Condition 3: Foam Surface, EO 30 Sec, Normal Sway  Condition 4: Foam Surface, EC 30 Sec, Moderate Sway    PATIENT SURVEYS:  DHI 32  VESTIBULAR ASSESSMENT:  GENERAL OBSERVATION: wears prescription glasses   SYMPTOM BEHAVIOR:  Subjective history: approx 1 year onset of dizziness/motion sensitivity  Non-Vestibular symptoms: neck pain  Type of dizziness: Imbalance (Disequilibrium), Unsteady with head/body turns, and Swimmyheaded  Frequency: daily/episodic  Duration: seconds/minutes  Aggravating factors: Induced by position change: lying supine, supine to sit, and sit to stand, Induced by motion: looking up at the ceiling, bending down to the ground, turning body quickly, and turning head quickly, and Worse in the dark  Relieving factors: slow movements and avoid busy/distracting environments  Progression of symptoms: unchanged  OCULOMOTOR EXAM:  Ocular Alignment: normal  Ocular ROM: No Limitations  Spontaneous  Nystagmus: absent  Gaze-Induced Nystagmus: absent--reports symptom provocation looking up  Smooth Pursuits: intact  Saccades: intact--vertical symptomatic  Convergence/Divergence: 6 cm --right eye loses fusion after 3 sec    VESTIBULAR - OCULAR REFLEX:   Slow VOR: Comment: horizontal < vertical for swimmy-headed  VOR Cancellation: Normal  Head-Impulse Test: HIT Right: negative HIT Left: negative  Dynamic Visual Acuity: Not able to be assessed   POSITIONAL TESTING: Right Dix-Hallpike: no nystagmus Left Dix-Hallpike: no nystagmus Right Roll Test: no nystagmus Left Roll Test: no nystagmus  MOTION SENSITIVITY:  Motion Sensitivity Quotient Intensity: 0 = none, 1 = Lightheaded, 2 = Mild, 3 = Moderate, 4 = Severe, 5 =  Vomiting  Intensity  1. Sitting to supine   2. Supine to L side   3. Supine to R side   4. Supine to sitting   5. L Hallpike-Dix   6. Up from L    7. R Hallpike-Dix   8. Up from R    9. Sitting, head tipped to L knee   10. Head up from L knee   11. Sitting, head tipped to R knee   12. Head up from R knee   13. Sitting head turns x5   14.Sitting head nods x5   15. In stance, 180 turn to L    16. In stance, 180 turn to R     OTHOSTATICS: not done                                                                                                                               TREATMENT DATE: 09/03/23  See below for HEP development  PATIENT EDUCATION: Education details: assessment details, HEP initiation Person educated: Patient Education method: Explanation Education comprehension: verbalized understanding  HOME EXERCISE PROGRAM: Access Code: [email or web address] URL: [email or web address] Date: 09/03/2023 Prepared by: Burnard Sandifer  Exercises - Seated Gaze Stabilization with Head Rotation  - 1 x daily - 7 x weekly - 3-5 sets - 15-30 sec hold - Seated Gaze Stabilization with Head Nod  - 1 x daily - 7 x weekly - 3-5 sets - 30 sec hold - Corner Balance Feet Together With Eyes Closed  - 1 x daily - 7 x weekly - 3 sets - 30 sec hold - Corner Balance Feet Together: Eyes Closed With Head Turns  - 1 x daily - 7 x weekly - 3 sets - 30 sec hold - Cervical Retraction with Resistance  - 1 x daily - 7 x weekly - 3 sets - 10 reps - 2-3 sec hold   GOALS: Goals reviewed with patient? Yes  SHORT TERM GOALS: Target date: same as LTG   LONG TERM GOALS: Target date: 10/08/2023    The patient will be independent with HEP for gaze adaptation, habituation, balance, and general mobility. Baseline:  Goal status: INITIAL  2.  FGA to be performed and goal constructed Baseline: TBD Goal status: INITIAL  3.  Demo improved  postural stability/proprioceptive awareness per normal-mild sway  x 30 sec condition 4 M-CTSIB Baseline: moderate Goal status: INITIAL  4.  Pt to report symptoms not exceeding 2/10 during household activities and looking up Baseline: 4+/10 Goal status: INITIAL    ASSESSMENT:  CLINICAL IMPRESSION: Patient arrived to session with report of improvement in dizziness since stopping a few meds. Upon discussion, pt reports hx of motion sickness as a child and reports some visual motion sensitivity with tasks like using the phone, computer, or driving. Trialed addition of optokinetic challenges which were aggravating to pt's sx. Adjusted speed to improve tolerance. patient reported understanding of HEP update and without complaints upon leaving.   OBJECTIVE IMPAIRMENTS: decreased activity tolerance, decreased balance, dizziness, and pain.   ACTIVITY LIMITATIONS: lifting, bending, transfers, reach over head, and locomotion level  PARTICIPATION LIMITATIONS: meal prep, cleaning, shopping, community activity, occupation, and yard work  PERSONAL FACTORS: Age, Time since onset of injury/illness/exacerbation, and 1 comorbidity: neck pain are also affecting patient's functional outcome.   REHAB POTENTIAL: Good  CLINICAL DECISION MAKING: Stable/uncomplicated  EVALUATION COMPLEXITY: Moderate   PLAN:  PT FREQUENCY: 1x/week  PT DURATION: other: 5 weeks  PLANNED INTERVENTIONS: 97750- Physical Performance Testing, 97110-Therapeutic exercises, 97530- Therapeutic activity, [id number]- Neuromuscular re-education, 97535- Self Care, 02859- Manual therapy, [id number]- Gait training, [phone]- Canalith repositioning, [id number]- Aquatic Therapy, H9716- Electrical stimulation (unattended), [id number]- Traction (mechanical), and 20560 (1-2 muscles), 20561 (3+ muscles)- Dry Needling  PLAN FOR NEXT SESSION: optokinetic challenges, Continue to progress VOR and habituation exercises as able; cervical spine manual and repeat balance testing to see if this helps?, VOR at speed?   Louana Terrilyn Christians, PT, DPT 09/28/23 8:24 AM  [facility] at [facility] [address] [place], [address] [place], [place] [address] Phone # [phone] Fax # [phone]

## 2023-09-29 ENCOUNTER — Other Ambulatory Visit (HOSPITAL_COMMUNITY): Payer: Self-pay

## 2023-10-01 ENCOUNTER — Ambulatory Visit: Admitting: Physical Therapy

## 2023-10-01 ENCOUNTER — Encounter: Payer: Self-pay | Admitting: Physical Therapy

## 2023-10-01 DIAGNOSIS — R42 Dizziness and giddiness: Secondary | ICD-10-CM

## 2023-10-01 DIAGNOSIS — M542 Cervicalgia: Secondary | ICD-10-CM | POA: Diagnosis not present

## 2023-10-08 ENCOUNTER — Other Ambulatory Visit (HOSPITAL_COMMUNITY): Payer: Self-pay

## 2023-10-08 ENCOUNTER — Ambulatory Visit

## 2023-10-09 ENCOUNTER — Other Ambulatory Visit (HOSPITAL_COMMUNITY): Payer: Self-pay

## 2023-10-09 MED ORDER — ACETAZOLAMIDE 125 MG PO TABS
125.0000 mg | ORAL_TABLET | Freq: Two times a day (BID) | ORAL | 1 refills | Status: AC
  Filled 2023-10-09: qty 28, 14d supply, fill #0
  Filled 2024-03-31: qty 28, 14d supply, fill #1

## 2023-10-12 NOTE — Therapy (Signed)
 OUTPATIENT PHYSICAL THERAPY VESTIBULAR TREATMENT     Patient Name: Andre Perez MRN: [id number] DOB:09/26/1985, 38 y.o., male Today's Date: 10/15/2023  END OF SESSION:  PT End of Session - 10/15/23 1655     Visit Number 6    Number of Visits 9    Date for PT Re-Evaluation 11/05/23    Authorization Type [employer]    PT Start Time 1617    PT Stop Time 1655    PT Time Calculation (min) 38 min    Activity Tolerance Patient tolerated treatment well    Behavior During Therapy WFL for tasks assessed/performed              Past Medical History:  Diagnosis Date   Anxiety    Back pain    GERD (gastroesophageal reflux disease)    Infertility male    Lactose intolerance    Obesity    Sleep apnea    Past Surgical History:  Procedure Laterality Date   TESTICLE REMOVAL     TESTICULAR PROSTHETIC INSERTION     Patient Active Problem List   Diagnosis Date Noted   Gastroesophageal reflux disease 04/19/2020   Gynecomastia 04/19/2020   Lipoprotein deficiency disorder 04/19/2020   Obstructive sleep apnea syndrome 04/19/2020   Vitamin D  deficiency 11/17/2019   Insulin  resistance 11/17/2019   Visceral obesity 11/17/2019   Depression 11/17/2019    PCP: Earlyne Motto, DO REFERRING PROVIDER: Joane Artist RAMAN, MD  REFERRING DIAG:  R42 (ICD-10-CM) - Dizziness    THERAPY DIAG:  Dizziness and giddiness  Cervicalgia  ONSET DATE: approx 1 year  Rationale for Evaluation and Treatment: Rehabilitation  SUBJECTIVE:   SUBJECTIVE STATEMENT: Reports that he did some travel- wore motion sickness glasses and he felt better. Reports symptoms are currently 1/10.    Pt accompanied by: self  PERTINENT HISTORY:   PAIN:  Are you having pain? Yes: NPRS scale: 2/10 Pain location: neck and L lateral 3 fingers  Pain description: sharp/ache, N/T in fingers (pt reports this is new) Aggravating factors: lifting, sitting w/ poor posture Relieving factors: rest  PRECAUTIONS:  None  RED FLAGS: None   WEIGHT BEARING RESTRICTIONS: No  FALLS: Has patient fallen in last 6 months? No  LIVING ENVIRONMENT: Lives with: lives with their family Lives in: House/apartment Stairs:  Has following equipment at home: None  PLOF: Independent, works as Pensions consultant  PATIENT GOALS: address symptoms  OBJECTIVE:      TODAY'S TREATMENT: 10/15/23 Activity Comments  wall bumps on foam EO/EC Cues for pacing and use of grounding techniques for improved spatial awareness. No dizziness   gait + head turns/nods and VOR C/o slight uneasiness/lighteadedness with VOR 1/10  fwd/back stepping + head nods/turns No dizziness; mild imbalance with head nods, moderate imbalance with head turns.   Standing VOR cancellation vertical and horizontal 30 1-2/10 dizziness   Standing VOR horizontal/vertical in front of blinds 30  1-2/10 dizziness. Report of increased L hand pain with vertical VOR   rockerboard EO/EC static and dynamic Report of up to 3-4/10 queasiness      PATIENT EDUCATION: Education details: POC Person educated: Patient Education method: Explanation Education comprehension: verbalized understanding      HOME EXERCISE PROGRAM Access Code: [email or web address] URL: [email or web address] Date: 10/01/2023 Prepared by: [facility] - Outpatient  Rehab - [facility]  Exercises - Seated Gaze Stabilization with Head Rotation  - 1 x daily - 7 x weekly - 3-5 sets - 15-30 sec hold - Seated Gaze Stabilization with Head Nod  -  1 x daily - 7 x weekly - 3-5 sets - 30 sec hold - Corner Balance Feet Together With Eyes Closed  - 1 x daily - 7 x weekly - 3 sets - 30 sec hold - Corner Balance Feet Together: Eyes Closed With Head Turns  - 1 x daily - 7 x weekly - 3 sets - 30 sec hold - Cervical Retraction with Resistance  - 1 x daily - 7 x weekly - 3 sets - 10 reps - 2-3 sec hold - Standing with Head Rotation  - 1 x daily - 5 x weekly - 2-3 sets - 30 sec hold - Standing VOR  Cancellation  - 1 x daily - 5 x weekly - 2-3 sets - 30 sec hold - Side to Side Balance on Rocker Board at Counter  - 1 x daily - 5 x weekly - 2-3 sets - 30 sec hold - Front to [employer] with Unilateral Counter Support  - 1 x daily - 5 x weekly - 2-3 sets - 30 sec hold     PATIENT EDUCATION: Education details: edu on multisensory balance and need for increased vestibular use d/t pt reporting dizziness with EC, HEP update, POC Person educated: Patient Education method: Explanation, Demonstration, Tactile cues, Verbal cues, and Handouts Education comprehension: verbalized understanding and returned demonstration    Note: Objective measures were completed at Evaluation unless otherwise noted.  DIAGNOSTIC FINDINGS: Cervical spine MRI November 2024  IMPRESSION: 1. Normal MRI appearance of the cervical spinal cord. No cord signal changes to suggest myelopathy. 2. Right paracentral disc protrusion with uncovertebral spurring at C4-5 with resultant mild spinal stenosis, with mild left C5 foraminal narrowing. 3. Additional mild noncompressive disc bulging at C2-3 through C7-T1 without significant stenosis or impingement  NCV & EMG Findings: Extensive electrodiagnostic testing of the left upper extremity shows:  Left median sensory response shows reduced amplitude (L8.1 V).  Left ulnar, radial, mixed palmar, medial antebrachial, and lateral antebrachial cutaneous sensory responses are within normal limits.  Left median and ulnar motor responses are within normal limits.  Chronic motor axonal loss changes are seen affecting the left pronator teres, flexor pollicis longus, and abductor pollicis brevis muscle, without accompanied active denervation.   Impression: The electrophysiologic findings are consistent with a chronic left median neuropathy proximal to the branch point to the pronator teres muscle.  Overall, these findings are mild-to-moderate in degree  electrically.  COGNITION: Overall cognitive status: Within functional limits for tasks assessed     POSTURE:  No Significant postural limitations  Cervical ROM:    Active A/PROM (deg) eval  Flexion 40  Extension 50  Right lateral flexion 45  Left lateral flexion 30  Right rotation 60  Left rotation 70  (Blank rows = not tested) ---cervical extension provocative to symtoms: swimmyhead/queasy   STRENGTH:   LOWER EXTREMITY MMT:   MMT Right eval Left eval  Hip flexion    Hip abduction    Hip adduction    Hip internal rotation    Hip external rotation    Knee flexion    Knee extension    Ankle dorsiflexion    Ankle plantarflexion    Ankle inversion    Ankle eversion    (Blank rows = not tested)   GAIT: Gait pattern: WFL Distance walked:  Assistive device utilized: None Level of assistance: Complete Independence Comments:   FUNCTIONAL TESTS:  Functional gait assessment: TBD  M-CTSIB  Condition 1: Firm Surface, EO  30 Sec, Normal Sway  Condition 2: Firm Surface, EC 30 Sec, Normal Sway  Condition 3: Foam Surface, EO 30 Sec, Normal Sway  Condition 4: Foam Surface, EC 30 Sec, Moderate Sway    PATIENT SURVEYS:  DHI 32  VESTIBULAR ASSESSMENT:  GENERAL OBSERVATION: wears prescription glasses   SYMPTOM BEHAVIOR:  Subjective history: approx 1 year onset of dizziness/motion sensitivity  Non-Vestibular symptoms: neck pain  Type of dizziness: Imbalance (Disequilibrium), Unsteady with head/body turns, and Swimmyheaded  Frequency: daily/episodic  Duration: seconds/minutes  Aggravating factors: Induced by position change: lying supine, supine to sit, and sit to stand, Induced by motion: looking up at the ceiling, bending down to the ground, turning body quickly, and turning head quickly, and Worse in the dark  Relieving factors: slow movements and avoid busy/distracting environments  Progression of symptoms: unchanged  OCULOMOTOR EXAM:  Ocular Alignment:  normal  Ocular ROM: No Limitations  Spontaneous Nystagmus: absent  Gaze-Induced Nystagmus: absent--reports symptom provocation looking up  Smooth Pursuits: intact  Saccades: intact--vertical symptomatic  Convergence/Divergence: 6 cm --right eye loses fusion after 3 sec    VESTIBULAR - OCULAR REFLEX:   Slow VOR: Comment: horizontal < vertical for swimmy-headed  VOR Cancellation: Normal  Head-Impulse Test: HIT Right: negative HIT Left: negative  Dynamic Visual Acuity: Not able to be assessed   POSITIONAL TESTING: Right Dix-Hallpike: no nystagmus Left Dix-Hallpike: no nystagmus Right Roll Test: no nystagmus Left Roll Test: no nystagmus  MOTION SENSITIVITY:  Motion Sensitivity Quotient Intensity: 0 = none, 1 = Lightheaded, 2 = Mild, 3 = Moderate, 4 = Severe, 5 = Vomiting  Intensity  1. Sitting to supine   2. Supine to L side   3. Supine to R side   4. Supine to sitting   5. L Hallpike-Dix   6. Up from L    7. R Hallpike-Dix   8. Up from R    9. Sitting, head tipped to L knee   10. Head up from L knee   11. Sitting, head tipped to R knee   12. Head up from R knee   13. Sitting head turns x5   14.Sitting head nods x5   15. In stance, 180 turn to L    16. In stance, 180 turn to R     OTHOSTATICS: not done                                                                                                                               TREATMENT DATE: 09/03/23  See below for HEP development  PATIENT EDUCATION: Education details: assessment details, HEP initiation Person educated: Patient Education method: Explanation Education comprehension: verbalized understanding  HOME EXERCISE PROGRAM: Access Code: [email or web address] URL: [email or web address] Date: 09/03/2023 Prepared by: Kelly Halpin  Exercises - Seated Gaze Stabilization with Head Rotation  - 1 x daily - 7 x weekly - 3-5 sets - 15-30 sec hold - Seated Gaze Stabilization with Head Nod  -  1 x daily - 7 x  weekly - 3-5 sets - 30 sec hold - Corner Balance Feet Together With Eyes Closed  - 1 x daily - 7 x weekly - 3 sets - 30 sec hold - Corner Balance Feet Together: Eyes Closed With Head Turns  - 1 x daily - 7 x weekly - 3 sets - 30 sec hold - Cervical Retraction with Resistance  - 1 x daily - 7 x weekly - 3 sets - 10 reps - 2-3 sec hold   GOALS: Goals reviewed with patient? Yes  SHORT TERM GOALS: Target date: same as LTG   LONG TERM GOALS: Target date: 11/05/2023    The patient will be independent with HEP for gaze adaptation, habituation, balance, and general mobility. Baseline:  Goal status: IN PROGRESS 10/01/23  2.  FGA to be performed and goal constructed Baseline: 30 09/11/23 Goal status: MET 09/11/23  3.  Demo improved postural stability/proprioceptive awareness per normal-mild sway x 30 sec condition 4 M-CTSIB Baseline: moderate; 30 sec mild-moderate sway 10/01/23 Goal status: IN PROGRESS 10/01/23  4.  Pt to report symptoms not exceeding 2/10 during household activities and looking up Baseline: 4+/10 Goal status: IN PROGRESS 10/01/23    ASSESSMENT:  CLINICAL IMPRESSION: Patient arrived to session with report of continued improvement in symptoms. Balance activities worked on reducing visual reliance with use of compliant surfaces, EC, head movements. Patient with significantly less intense symptoms with today's activities. Imbalance evident with stepping with head turns activity. Plan for likely DC on next scheduled appointment if pt continues to improve. Pt in agreement with this plan.  OBJECTIVE IMPAIRMENTS: decreased activity tolerance, decreased balance, dizziness, and pain.   ACTIVITY LIMITATIONS: lifting, bending, transfers, reach over head, and locomotion level  PARTICIPATION LIMITATIONS: meal prep, cleaning, shopping, community activity, occupation, and yard work  PERSONAL FACTORS: Age, Time since onset of injury/illness/exacerbation, and 1 comorbidity: neck pain are  also affecting patient's functional outcome.   REHAB POTENTIAL: Good  CLINICAL DECISION MAKING: Stable/uncomplicated  EVALUATION COMPLEXITY: Moderate   PLAN:  PT FREQUENCY: 1x/week  PT DURATION: other: 4 weeks  PLANNED INTERVENTIONS: 97750- Physical Performance Testing, 97110-Therapeutic exercises, 97530- Therapeutic activity, [id number]- Neuromuscular re-education, 97535- Self Care, [id number]- Manual therapy, [phone]- Gait training, [phone]- Canalith repositioning, [id number]- Aquatic Therapy, [phone]- Electrical stimulation (unattended), [id number]- Traction (mechanical), and 20560 (1-2 muscles), 20561 (3+ muscles)- Dry Needling  PLAN FOR NEXT SESSION: possible DC   Louana Terrilyn Christians, PT, DPT 10/15/23 4:56 PM  [facility] [address] [place], [place] [address] Phone # [phone] Fax # [phone]

## 2023-10-15 ENCOUNTER — Encounter: Payer: Self-pay | Admitting: Physical Therapy

## 2023-10-15 ENCOUNTER — Ambulatory Visit: Admitting: Physical Therapy

## 2023-10-15 DIAGNOSIS — M542 Cervicalgia: Secondary | ICD-10-CM | POA: Diagnosis not present

## 2023-10-15 DIAGNOSIS — R42 Dizziness and giddiness: Secondary | ICD-10-CM | POA: Diagnosis not present

## 2023-10-26 NOTE — Therapy (Signed)
 OUTPATIENT PHYSICAL THERAPY VESTIBULAR DISCHARGE     Patient Name: Andre Perez MRN: [id number] DOB:1986-01-26, 38 y.o., male Today's Date: 10/29/2023  END OF SESSION:  PT End of Session - 10/29/23 1653     Visit Number 7    Number of Visits 9    Date for PT Re-Evaluation 11/05/23    Authorization Type BCBS    PT Start Time 1619    PT Stop Time 1648    PT Time Calculation (min) 29 min    Equipment Utilized During Treatment Gait belt    Activity Tolerance Patient tolerated treatment well    Behavior During Therapy WFL for tasks assessed/performed               Past Medical History:  Diagnosis Date   Anxiety    Back pain    GERD (gastroesophageal reflux disease)    Infertility male    Lactose intolerance    Obesity    Sleep apnea    Past Surgical History:  Procedure Laterality Date   TESTICLE REMOVAL     TESTICULAR PROSTHETIC INSERTION     Patient Active Problem List   Diagnosis Date Noted   Gastroesophageal reflux disease 04/19/2020   Gynecomastia 04/19/2020   Lipoprotein deficiency disorder 04/19/2020   Obstructive sleep apnea syndrome 04/19/2020   Vitamin D  deficiency 11/17/2019   Insulin  resistance 11/17/2019   Visceral obesity 11/17/2019   Depression 11/17/2019    PCP: Earlyne Motto, DO REFERRING PROVIDER: Joane Artist RAMAN, MD  REFERRING DIAG:  R42 (ICD-10-CM) - Dizziness    THERAPY DIAG:  Dizziness and giddiness  Cervicalgia  ONSET DATE: approx 1 year  Rationale for Evaluation and Treatment: Rehabilitation  SUBJECTIVE:   SUBJECTIVE STATEMENT: Reports that his hand pain was flared up last week but it is back to baseline. Dizziness has not been as much of an issue lately. Patient reports that he feels ready to wrap up with PT today- feels like the exercises have helped quite a bit. Patient reports that for the past couple of weeks the highest level of dizziness has been 2/10.    Pt accompanied by: self  PERTINENT HISTORY:    PAIN:  Are you having pain? Yes: NPRS scale: 1-2/10 Pain location: neck and L lateral 3 fingers  Pain description: sharp/ache, N/T in fingers (pt reports this is new) Aggravating factors: lifting, sitting w/ poor posture Relieving factors: rest  PRECAUTIONS: None  RED FLAGS: None   WEIGHT BEARING RESTRICTIONS: No  FALLS: Has patient fallen in last 6 months? No  LIVING ENVIRONMENT: Lives with: lives with their family Lives in: House/apartment Stairs:  Has following equipment at home: None  PLOF: Independent, works as Pensions consultant  PATIENT GOALS: address symptoms  OBJECTIVE:    TODAY'S TREATMENT: 10/29/23 Activity Comments  MCTSIB #3 Normal-mild sway   MCTSIB #4 Mild-mod sway   Review of HEP to assess for tolerance  Report of 2-3/10 dizziness with VOR  Report of 5/10 dizziness with EC + head turns   C/o tendency to want to close yes with with head turns in front of blinds  C/o 3/10 dizziness with VOR cancellation                   HOME EXERCISE PROGRAM Access Code: [email or web address] URL: [email or web address] Date: 10/29/2023 Prepared by: [facility] - Outpatient  Rehab - [facility]  Exercises - Corner Balance Feet Together: Eyes Closed With Head Turns  - 1 x daily - 7 x weekly - 3  sets - 30 sec hold - Standing with Head Rotation  - 1 x daily - 5 x weekly - 2-3 sets - 30 sec hold - Side to Side Balance on Rocker Board at Counter  - 1 x daily - 5 x weekly - 2-3 sets - 30 sec hold - Front to [employer] with Unilateral Counter Support  - 1 x daily - 5 x weekly - 2-3 sets - 30 sec hold    PATIENT EDUCATION: Education details: review of progress towards goals and remaining impairments, HEP consolidation, DC Person educated: Patient Education method: Explanation, Demonstration, Tactile cues, Verbal cues, and Handouts Education comprehension: verbalized understanding and returned demonstration    Note: Objective  measures were completed at Evaluation unless otherwise noted.  DIAGNOSTIC FINDINGS: Cervical spine MRI November 2024  IMPRESSION: 1. Normal MRI appearance of the cervical spinal cord. No cord signal changes to suggest myelopathy. 2. Right paracentral disc protrusion with uncovertebral spurring at C4-5 with resultant mild spinal stenosis, with mild left C5 foraminal narrowing. 3. Additional mild noncompressive disc bulging at C2-3 through C7-T1 without significant stenosis or impingement  NCV & EMG Findings: Extensive electrodiagnostic testing of the left upper extremity shows:  Left median sensory response shows reduced amplitude (L8.1 V).  Left ulnar, radial, mixed palmar, medial antebrachial, and lateral antebrachial cutaneous sensory responses are within normal limits.  Left median and ulnar motor responses are within normal limits.  Chronic motor axonal loss changes are seen affecting the left pronator teres, flexor pollicis longus, and abductor pollicis brevis muscle, without accompanied active denervation.   Impression: The electrophysiologic findings are consistent with a chronic left median neuropathy proximal to the branch point to the pronator teres muscle.  Overall, these findings are mild-to-moderate in degree electrically.  COGNITION: Overall cognitive status: Within functional limits for tasks assessed     POSTURE:  No Significant postural limitations  Cervical ROM:    Active A/PROM (deg) eval  Flexion 40  Extension 50  Right lateral flexion 45  Left lateral flexion 30  Right rotation 60  Left rotation 70  (Blank rows = not tested) ---cervical extension provocative to symtoms: swimmyhead/queasy   STRENGTH:   LOWER EXTREMITY MMT:   MMT Right eval Left eval  Hip flexion    Hip abduction    Hip adduction    Hip internal rotation    Hip external rotation    Knee flexion    Knee extension    Ankle dorsiflexion    Ankle plantarflexion    Ankle  inversion    Ankle eversion    (Blank rows = not tested)   GAIT: Gait pattern: WFL Distance walked:  Assistive device utilized: None Level of assistance: Complete Independence Comments:   FUNCTIONAL TESTS:  Functional gait assessment: TBD  M-CTSIB  Condition 1: Firm Surface, EO 30 Sec, Normal Sway  Condition 2: Firm Surface, EC 30 Sec, Normal Sway  Condition 3: Foam Surface, EO 30 Sec, Normal Sway  Condition 4: Foam Surface, EC 30 Sec, Moderate Sway    PATIENT SURVEYS:  DHI 32  VESTIBULAR ASSESSMENT:  GENERAL OBSERVATION: wears prescription glasses   SYMPTOM BEHAVIOR:  Subjective history: approx 1 year onset of dizziness/motion sensitivity  Non-Vestibular symptoms: neck pain  Type of dizziness: Imbalance (Disequilibrium), Unsteady with head/body turns, and Swimmyheaded  Frequency: daily/episodic  Duration: seconds/minutes  Aggravating factors: Induced by position change: lying supine, supine to sit, and sit to stand, Induced by motion: looking up at the ceiling,  bending down to the ground, turning body quickly, and turning head quickly, and Worse in the dark  Relieving factors: slow movements and avoid busy/distracting environments  Progression of symptoms: unchanged  OCULOMOTOR EXAM:  Ocular Alignment: normal  Ocular ROM: No Limitations  Spontaneous Nystagmus: absent  Gaze-Induced Nystagmus: absent--reports symptom provocation looking up  Smooth Pursuits: intact  Saccades: intact--vertical symptomatic  Convergence/Divergence: 6 cm --right eye loses fusion after 3 sec    VESTIBULAR - OCULAR REFLEX:   Slow VOR: Comment: horizontal < vertical for swimmy-headed  VOR Cancellation: Normal  Head-Impulse Test: HIT Right: negative HIT Left: negative  Dynamic Visual Acuity: Not able to be assessed   POSITIONAL TESTING: Right Dix-Hallpike: no nystagmus Left Dix-Hallpike: no nystagmus Right Roll Test: no nystagmus Left Roll Test: no nystagmus  MOTION  SENSITIVITY:  Motion Sensitivity Quotient Intensity: 0 = none, 1 = Lightheaded, 2 = Mild, 3 = Moderate, 4 = Severe, 5 = Vomiting  Intensity  1. Sitting to supine   2. Supine to L side   3. Supine to R side   4. Supine to sitting   5. L Hallpike-Dix   6. Up from L    7. R Hallpike-Dix   8. Up from R    9. Sitting, head tipped to L knee   10. Head up from L knee   11. Sitting, head tipped to R knee   12. Head up from R knee   13. Sitting head turns x5   14.Sitting head nods x5   15. In stance, 180 turn to L    16. In stance, 180 turn to R     OTHOSTATICS: not done                                                                                                                               TREATMENT DATE: 09/03/23  See below for HEP development  PATIENT EDUCATION: Education details: assessment details, HEP initiation Person educated: Patient Education method: Explanation Education comprehension: verbalized understanding  HOME EXERCISE PROGRAM: Access Code: [email or web address] URL: [email or web address] Date: 09/03/2023 Prepared by: Burnard Sandifer  Exercises - Seated Gaze Stabilization with Head Rotation  - 1 x daily - 7 x weekly - 3-5 sets - 15-30 sec hold - Seated Gaze Stabilization with Head Nod  - 1 x daily - 7 x weekly - 3-5 sets - 30 sec hold - Corner Balance Feet Together With Eyes Closed  - 1 x daily - 7 x weekly - 3 sets - 30 sec hold - Corner Balance Feet Together: Eyes Closed With Head Turns  - 1 x daily - 7 x weekly - 3 sets - 30 sec hold - Cervical Retraction with Resistance  - 1 x daily - 7 x weekly - 3 sets - 10 reps - 2-3 sec hold   GOALS: Goals reviewed with patient? Yes  SHORT TERM GOALS: Target date: same as LTG   LONG TERM GOALS: Target  date: 11/05/2023    The patient will be independent with HEP for gaze adaptation, habituation, balance, and general mobility. Baseline:  Goal status: MET 10/29/23  2.  FGA to be performed and goal  constructed Baseline: 30 09/11/23 Goal status: MET 09/11/23  3.  Demo improved postural stability/proprioceptive awareness per normal-mild sway x 30 sec condition 4 M-CTSIB Baseline: moderate; 30 sec mild-moderate sway 10/01/23; Mild-mod sway 10/29/23 Goal status: NOT MET 10/29/23  4.  Pt to report symptoms not exceeding 2/10 during household activities and looking up Baseline: 4+/10; Patient reports that for the past couple of weeks the highest level of dizziness has been 2/10 10/29/23 Goal status: MET 10/29/23    ASSESSMENT:  CLINICAL IMPRESSION: Patient arrived to session with report of improvement in dizziness, with symptoms reaching 2/10 at worst in the past couple of weeks. Multisensory balance testing revealed mild-moderate remaining sway. Detailed performance and review of HEP allowed for more consolidated program to prepare for DC. Patient reported understanding. Patient is ready for DC at this time.   OBJECTIVE IMPAIRMENTS: decreased activity tolerance, decreased balance, dizziness, and pain.   ACTIVITY LIMITATIONS: lifting, bending, transfers, reach over head, and locomotion level  PARTICIPATION LIMITATIONS: meal prep, cleaning, shopping, community activity, occupation, and yard work  PERSONAL FACTORS: Age, Time since onset of injury/illness/exacerbation, and 1 comorbidity: neck pain are also affecting patient's functional outcome.   REHAB POTENTIAL: Good  CLINICAL DECISION MAKING: Stable/uncomplicated  EVALUATION COMPLEXITY: Moderate   PLAN:  PT FREQUENCY: 1x/week  PT DURATION: other: 4 weeks  PLANNED INTERVENTIONS: 97750- Physical Performance Testing, 97110-Therapeutic exercises, 97530- Therapeutic activity, [id number]- Neuromuscular re-education, 97535- Self Care, [id number]- Manual therapy, [id number]- Gait training, [phone]- Canalith repositioning, [id number]- Aquatic Therapy, [phone]- Electrical stimulation (unattended), [phone]- Traction (mechanical), and 20560 (1-2 muscles), 20561 (3+  muscles)- Dry Needling  PLAN FOR NEXT SESSION: DC at this time   PHYSICAL THERAPY DISCHARGE SUMMARY  Visits from Start of Care: 7   Current functional level related to goals / functional outcomes: See above clinical impression   Remaining deficits: Mild imbalance   Education / Equipment: HEP  Plan: Patient agrees to discharge.  Patient goals were partially met. Patient is being discharged due to meeting the stated rehab goals.         Louana Terrilyn Christians, PT, DPT 10/29/23 4:54 PM  [facility] [address] [place], [address] [place], [place] [address] Phone # [phone] Fax # [phone]

## 2023-10-29 ENCOUNTER — Ambulatory Visit: Attending: Family Medicine | Admitting: Physical Therapy

## 2023-10-29 ENCOUNTER — Encounter: Payer: Self-pay | Admitting: Physical Therapy

## 2023-10-29 DIAGNOSIS — R42 Dizziness and giddiness: Secondary | ICD-10-CM | POA: Diagnosis not present

## 2023-10-29 DIAGNOSIS — M542 Cervicalgia: Secondary | ICD-10-CM | POA: Diagnosis not present

## 2023-10-30 ENCOUNTER — Other Ambulatory Visit (HOSPITAL_COMMUNITY): Payer: Self-pay

## 2023-11-05 ENCOUNTER — Other Ambulatory Visit (HOSPITAL_COMMUNITY): Payer: Self-pay

## 2023-11-07 ENCOUNTER — Other Ambulatory Visit (HOSPITAL_COMMUNITY): Payer: Self-pay

## 2023-11-07 MED ORDER — CLOMIPHENE CITRATE 50 MG PO TABS
25.0000 mg | ORAL_TABLET | ORAL | 0 refills | Status: DC
  Filled 2023-11-07: qty 4, 28d supply, fill #0

## 2023-11-07 MED ORDER — MELOXICAM 15 MG PO TABS
15.0000 mg | ORAL_TABLET | Freq: Every day | ORAL | 0 refills | Status: DC
  Filled 2023-11-07: qty 30, 30d supply, fill #0

## 2023-11-07 MED ORDER — PANTOPRAZOLE SODIUM 40 MG PO TBEC
40.0000 mg | DELAYED_RELEASE_TABLET | Freq: Every day | ORAL | 0 refills | Status: DC | PRN
  Filled 2023-11-07 – 2023-11-19 (×2): qty 30, 30d supply, fill #0

## 2023-11-07 MED ORDER — TRAZODONE HCL 50 MG PO TABS
50.0000 mg | ORAL_TABLET | Freq: Every evening | ORAL | 0 refills | Status: DC | PRN
  Filled 2023-11-07 – 2023-11-19 (×2): qty 30, 30d supply, fill #0

## 2023-11-09 DIAGNOSIS — Z713 Dietary counseling and surveillance: Secondary | ICD-10-CM | POA: Diagnosis not present

## 2023-11-13 ENCOUNTER — Other Ambulatory Visit (HOSPITAL_COMMUNITY): Payer: Self-pay

## 2023-11-19 ENCOUNTER — Other Ambulatory Visit (HOSPITAL_COMMUNITY): Payer: Self-pay

## 2023-11-21 NOTE — Progress Notes (Unsigned)
 Andre Ileana Collet, PhD, LAT, ATC acting as a scribe for Artist Lloyd, MD.  Andre Perez is a 38 y.o. male who presents to [facility] at [facility] today for cont'd L arm pain w/ MRI review. Pt was last seen by Dr. Lloyd on 08/23/23 and a brachial plexus MRI was ordered.   Today, pt reports L arm pain is feeling about the same. Good days and bad. Pain is located in the lateral forearm and into the L hand w/ tightness into the L shoulder. He notes at times the R shoulder will also hurt.   At previous visits we also talked about ADHD medicine.  He is interested in considering Focalin .  His son has ADHD and he tolerates it well.  He does get receive care from Dr. Prentiss.  Dx testing: 09/18/23 Brachial plexus MRI 02/05/23 C-spine MRI 01/22/23 C-spine XR 01/18/23 NCV study  Pertinent review of systems: No fevers or chills  Relevant historical information: Obstructive sleep apnea.  Obesity. History of left clavicle fracture.  Exam:  BP 112/84   Pulse 98   Ht 6' 2 (1.88 m)   Wt 252 lb (114.3 kg)   SpO2 98%   BMI 32.35 kg/m  General: Well Developed, well nourished, and in no acute distress.   MSK: Left shoulder and arm normal.  Normal motion.  Some continued paresthesia into the left hand.    Lab and Radiology Results  MR BRACHIAL PLEXUS W/O CM LT Result Date: 09/20/2023 CLINICAL DATA:  Upper extremity paresthesia.  Left arm pain. EXAM: MRI OF UPPER LEFT EXTREMITY WITHOUT CONTRAST TECHNIQUE: Multiplanar, multisequence MR imaging of the left was performed. No intravenous contrast was administered. COMPARISON:  MRI cervical spine dated 02/05/2023. FINDINGS: Spinal cord Normal signal of the cervical cord. Redemonstrated right paracentral disc protrusion at C4-C5 with mild spinal stenosis and left uncovertebral spurring with mild left foraminal narrowing, better evaluated on the prior MRI of the cervical spine dated 02/05/2023. Brachial plexus: No impinging lesion,  abnormal T2 signal, or other specific abnormality is appreciated along the components of the brachial plexus. Muscles and tendons Musculature of the shoulder girdle and visualized chest wall appear normal without edema, atrophy, or fatty infiltration to suggest denervation changes. Rotator cuff tendons appear intact. Bones No cervical rib. The right first rib is normal in appearance. Postoperative changes of the left clavicle with associated susceptibility artifact. Visualized marrow structures are otherwise unremarkable. No fracture or marrow replacing lesion. Joints Sternoclavicular, acromioclavicular, and glenohumeral joints appear within normal limits. No joint effusion. Other findings None. IMPRESSION: 1. No impinging lesion, abnormal T2 signal, or other specific abnormality is appreciated along the components of the left brachial plexus. 2. Redemonstrated right paracentral disc protrusion at C4-C5 with mild spinal stenosis and left uncovertebral spurring with mild left foraminal narrowing, better evaluated on the prior MRI of the cervical spine dated 02/05/2023. 3. Postoperative changes of the left clavicle with associated susceptibility artifact. Electronically Signed   By: Harrietta Sherry M.D.   On: 09/20/2023 11:50   I, Artist Andre Perez, personally (independently) visualized and performed the interpretation of the images attached in this note.   Nerve conduction study from January 18, 2023 NCV & EMG Findings: Extensive electrodiagnostic testing of the left upper extremity shows:  Left median sensory response shows reduced amplitude (L8.1 V).  Left ulnar, radial, mixed palmar, medial antebrachial, and lateral antebrachial cutaneous sensory responses are within normal limits.  Left median and ulnar motor responses are within normal limits.  Chronic motor axonal loss changes are seen affecting the left pronator teres, flexor pollicis longus, and abductor pollicis brevis muscle, without accompanied active  denervation.   Impression: The electrophysiologic findings are consistent with a chronic left median neuropathy proximal to the branch point to the pronator teres muscle.  Overall, these findings are mild-to-moderate in degree electrically.       Assessment and Plan: 38 y.o. male with left arm paresthesia occurring after a clavicle fracture.  Advanced imaging has been unrevealing.  Nerve conduction study did indicate a median nerve injury proximal to the pronator teres.  This would indicate shoulder or brachial plexus related dysfunction.  He did have a clavicle fracture and could have bruised the brachial plexus.  MRI of the brachial plexus looks pretty much okay in July.  Plan for bit of watchful waiting.  Consider orthopedic surgery consultation with Dr. Genelle if needed.   As for ADHD after previous discussion and discussion with Dr. Prentiss we will go ahead and start Focalin  XR now and recheck in a month.   PDMP not reviewed this encounter. No orders of the defined types were placed in this encounter.  Meds ordered this encounter  Medications   dexmethylphenidate  (FOCALIN  XR) 10 MG 24 hr capsule    Sig: Take 1 capsule (10 mg total) by mouth daily.    Dispense:  30 capsule    Refill:  0     Discussed warning signs or symptoms. Please see discharge instructions. Patient expresses understanding.   The above documentation has been reviewed and is accurate and complete Artist Andre Perez, M.D.

## 2023-11-22 ENCOUNTER — Other Ambulatory Visit: Payer: Self-pay

## 2023-11-22 ENCOUNTER — Other Ambulatory Visit (HOSPITAL_COMMUNITY): Payer: Self-pay

## 2023-11-22 ENCOUNTER — Ambulatory Visit (INDEPENDENT_AMBULATORY_CARE_PROVIDER_SITE_OTHER): Admitting: Family Medicine

## 2023-11-22 VITALS — BP 112/84 | HR 98 | Ht 74.0 in | Wt 252.0 lb

## 2023-11-22 DIAGNOSIS — F909 Attention-deficit hyperactivity disorder, unspecified type: Secondary | ICD-10-CM | POA: Insufficient documentation

## 2023-11-22 DIAGNOSIS — F902 Attention-deficit hyperactivity disorder, combined type: Secondary | ICD-10-CM

## 2023-11-22 DIAGNOSIS — G540 Brachial plexus disorders: Secondary | ICD-10-CM

## 2023-11-22 MED ORDER — DEXMETHYLPHENIDATE HCL ER 10 MG PO CP24
10.0000 mg | ORAL_CAPSULE | Freq: Every day | ORAL | 0 refills | Status: DC
  Filled 2023-11-22: qty 30, 30d supply, fill #0

## 2023-11-22 NOTE — Patient Instructions (Addendum)
 Thank you for coming in today.   I've sent a prescription for Focalin  to your pharmacy.   Check back in 1 month

## 2023-11-23 ENCOUNTER — Other Ambulatory Visit (HOSPITAL_COMMUNITY): Payer: Self-pay

## 2023-11-23 ENCOUNTER — Encounter: Payer: Self-pay | Admitting: Family Medicine

## 2023-11-26 ENCOUNTER — Other Ambulatory Visit (HOSPITAL_COMMUNITY): Payer: Self-pay

## 2023-11-26 MED ORDER — TRAZODONE HCL 50 MG PO TABS
50.0000 mg | ORAL_TABLET | Freq: Every evening | ORAL | 0 refills | Status: DC | PRN
  Filled 2023-11-26: qty 30, 30d supply, fill #0

## 2023-11-26 MED ORDER — PANTOPRAZOLE SODIUM 40 MG PO TBEC
40.0000 mg | DELAYED_RELEASE_TABLET | Freq: Every day | ORAL | 3 refills | Status: AC | PRN
  Filled 2023-11-26: qty 90, 90d supply, fill #0
  Filled 2024-03-27: qty 90, 90d supply, fill #1

## 2023-11-26 MED ORDER — PANTOPRAZOLE SODIUM 40 MG PO TBEC
40.0000 mg | DELAYED_RELEASE_TABLET | Freq: Every day | ORAL | 0 refills | Status: AC | PRN
  Filled 2023-11-26 – 2024-02-12 (×3): qty 30, 30d supply, fill #0

## 2023-12-04 ENCOUNTER — Other Ambulatory Visit (HOSPITAL_COMMUNITY): Payer: Self-pay

## 2023-12-04 DIAGNOSIS — Z79899 Other long term (current) drug therapy: Secondary | ICD-10-CM | POA: Diagnosis not present

## 2023-12-04 DIAGNOSIS — B351 Tinea unguium: Secondary | ICD-10-CM | POA: Diagnosis not present

## 2023-12-04 DIAGNOSIS — Z23 Encounter for immunization: Secondary | ICD-10-CM | POA: Diagnosis not present

## 2023-12-04 DIAGNOSIS — Z0000 Encounter for general adult medical examination without abnormal findings: Secondary | ICD-10-CM | POA: Diagnosis not present

## 2023-12-04 DIAGNOSIS — B356 Tinea cruris: Secondary | ICD-10-CM | POA: Diagnosis not present

## 2023-12-04 DIAGNOSIS — E291 Testicular hypofunction: Secondary | ICD-10-CM | POA: Diagnosis not present

## 2023-12-04 MED ORDER — KETOCONAZOLE 2 % EX CREA
1.0000 | TOPICAL_CREAM | Freq: Every day | CUTANEOUS | 0 refills | Status: AC
  Filled 2023-12-04: qty 15, 15d supply, fill #0

## 2023-12-04 MED ORDER — TERBINAFINE HCL 250 MG PO TABS
250.0000 mg | ORAL_TABLET | Freq: Every day | ORAL | 0 refills | Status: AC
  Filled 2023-12-04: qty 84, 84d supply, fill #0

## 2023-12-05 ENCOUNTER — Encounter: Payer: Self-pay | Admitting: Family Medicine

## 2023-12-06 ENCOUNTER — Other Ambulatory Visit (HOSPITAL_COMMUNITY): Payer: Self-pay

## 2023-12-06 DIAGNOSIS — Z79899 Other long term (current) drug therapy: Secondary | ICD-10-CM | POA: Diagnosis not present

## 2023-12-06 DIAGNOSIS — R632 Polyphagia: Secondary | ICD-10-CM | POA: Diagnosis not present

## 2023-12-06 DIAGNOSIS — K219 Gastro-esophageal reflux disease without esophagitis: Secondary | ICD-10-CM | POA: Diagnosis not present

## 2023-12-06 DIAGNOSIS — G4733 Obstructive sleep apnea (adult) (pediatric): Secondary | ICD-10-CM | POA: Diagnosis not present

## 2023-12-06 DIAGNOSIS — R351 Nocturia: Secondary | ICD-10-CM | POA: Diagnosis not present

## 2023-12-06 MED ORDER — MELOXICAM 15 MG PO TABS
15.0000 mg | ORAL_TABLET | Freq: Every day | ORAL | 1 refills | Status: DC
  Filled 2023-12-06: qty 30, 30d supply, fill #0
  Filled 2024-01-01: qty 30, 30d supply, fill #1

## 2023-12-06 MED ORDER — DEXMETHYLPHENIDATE HCL ER 20 MG PO CP24
20.0000 mg | ORAL_CAPSULE | Freq: Every day | ORAL | 0 refills | Status: DC
  Filled 2023-12-06: qty 30, 30d supply, fill #0

## 2023-12-06 MED ORDER — PANTOPRAZOLE SODIUM 40 MG PO TBEC
DELAYED_RELEASE_TABLET | ORAL | 1 refills | Status: DC
  Filled 2023-12-06: qty 30, 30d supply, fill #0

## 2023-12-06 NOTE — Telephone Encounter (Signed)
 Forwarding to Dr. Denyse Amass to review and advise.

## 2023-12-12 ENCOUNTER — Other Ambulatory Visit (HOSPITAL_COMMUNITY): Payer: Self-pay

## 2023-12-12 MED ORDER — CLOMIPHENE CITRATE 50 MG PO TABS
25.0000 mg | ORAL_TABLET | ORAL | 0 refills | Status: DC
  Filled 2023-12-12: qty 4, 28d supply, fill #0

## 2023-12-18 DIAGNOSIS — Z713 Dietary counseling and surveillance: Secondary | ICD-10-CM | POA: Diagnosis not present

## 2023-12-20 ENCOUNTER — Ambulatory Visit (INDEPENDENT_AMBULATORY_CARE_PROVIDER_SITE_OTHER): Admitting: Family Medicine

## 2023-12-20 ENCOUNTER — Other Ambulatory Visit: Payer: Self-pay

## 2023-12-20 ENCOUNTER — Other Ambulatory Visit (HOSPITAL_COMMUNITY): Payer: Self-pay

## 2023-12-20 VITALS — BP 128/84 | HR 90 | Ht 74.0 in | Wt 249.0 lb

## 2023-12-20 DIAGNOSIS — F902 Attention-deficit hyperactivity disorder, combined type: Secondary | ICD-10-CM | POA: Diagnosis not present

## 2023-12-20 DIAGNOSIS — M5412 Radiculopathy, cervical region: Secondary | ICD-10-CM | POA: Diagnosis not present

## 2023-12-20 DIAGNOSIS — G540 Brachial plexus disorders: Secondary | ICD-10-CM | POA: Insufficient documentation

## 2023-12-20 MED ORDER — METHOCARBAMOL 500 MG PO TABS
500.0000 mg | ORAL_TABLET | Freq: Three times a day (TID) | ORAL | 12 refills | Status: AC | PRN
  Filled 2023-12-20: qty 90, 30d supply, fill #0
  Filled 2024-03-31: qty 90, 30d supply, fill #1

## 2023-12-20 MED ORDER — DEXMETHYLPHENIDATE HCL ER 20 MG PO CP24
20.0000 mg | ORAL_CAPSULE | Freq: Every day | ORAL | 0 refills | Status: DC
  Filled 2023-12-20 – 2024-01-09 (×2): qty 90, 90d supply, fill #0

## 2023-12-20 NOTE — Progress Notes (Signed)
   [name], PhD, LAT, ATC acting as a scribe for Artist Lloyd, MD.  Andre Perez is a 38 y.o. male who presents to [facility] at [facility] today for f/u L arm paresthesia occurring after a clavicle fx. Pt was last seen by Dr. Lloyd on 11/22/23 and was advised to plan for watchful waiting and prescribed Focalin  XR. Rx was later increased to 20mg .  Today, pt reports the past couple days is pain has been increased. He located pain to the R-side of his neck, jaw, trapz and into the R shoulder. Pain is sharp and stabbing. L arm is about the same.   Dx testing: 09/18/23 Brachial plexus MRI 02/05/23 C-spine MRI 01/22/23 C-spine XR 01/18/23 NCV study  Pertinent review of systems: No fevers or chills  Relevant historical information: Sleep apnea   Exam:  BP 128/84   Pulse 90   Ht 6' 2 (1.88 m)   Wt 249 lb (112.9 kg)   SpO2 98%   BMI 31.97 kg/m  General: Well Developed, well nourished, and in no acute distress.   MSK: C-spine: Normal appearing. Nontender palpation spinal midline.  Tender palpation right cervical paraspinal muscles.  Other extremity strength is intact.   Lab and Radiology Results No results found for this or any previous visit (from the past 72 hours). No results found.     Assessment and Plan: 38 y.o. male with new right cervical pain due to muscle spasm and dysfunction.  Has been ongoing for few days now.  He does already see physical therapy which she can continue for this issue if needed.  Recommend heating pad and I have prescribed methocarbamol to use as needed.  Additionally has persistent left arm paresthesia.  This has been a chronic ongoing issue this been hard to pin down.  Nerve conduction study did show concern for nerve impingement proximal to the level of the elbow.  Cervical MRI does show the potential for some mild cervical radiculopathy.  Will go ahead and order a new repeat epidural steroid injection as last 1 was in  December 2024.  Additionally I am communicating with an orthopedic surgery colleague and a neurosurgery colleague about potential for second opinion visits.  Lastly refilled Focalin .   PDMP reviewed during this encounter. Orders Placed This Encounter  Procedures   DG INJECT DIAG/THERA/INC NEEDLE/CATH/PLC EPI/CERV/THOR W/IMG    Standing Status:   Future    Expiration Date:   01/20/2024    Reason for Exam (SYMPTOM  OR DIAGNOSIS REQUIRED):   cervical radiculopathy    Preferred Imaging Location?:   [facility]   Meds ordered this encounter  Medications   methocarbamol (ROBAXIN) 500 MG tablet    Sig: Take 1 tablet (500 mg total) by mouth every 8 (eight) hours as needed for muscle spasms.    Dispense:  90 tablet    Refill:  12   dexmethylphenidate  (FOCALIN  XR) 20 MG 24 hr capsule    Sig: Take 1 capsule (20 mg total) by mouth daily.    Dispense:  90 capsule    Refill:  0     Discussed warning signs or symptoms. Please see discharge instructions. Patient expresses understanding.   The above documentation has been reviewed and is accurate and complete Artist Lloyd, M.D.

## 2023-12-20 NOTE — Patient Instructions (Addendum)
 Thank you for coming in today.   Please call [facility] ([facility]) at [phone] to schedule your spine injection.    Medications have been refilled

## 2023-12-27 DIAGNOSIS — R351 Nocturia: Secondary | ICD-10-CM | POA: Diagnosis not present

## 2023-12-27 DIAGNOSIS — E669 Obesity, unspecified: Secondary | ICD-10-CM | POA: Diagnosis not present

## 2023-12-27 DIAGNOSIS — G4733 Obstructive sleep apnea (adult) (pediatric): Secondary | ICD-10-CM | POA: Diagnosis not present

## 2023-12-31 NOTE — Progress Notes (Signed)
 Referring Physician:  Dayna Motto, DO [address] [place],  [place] [address]  Primary Physician:  Dayna Motto, DO  History of Present Illness: 01/09/2024 Andre Perez is here today with a chief complaint of left upper extremity pain.  He has complicated past medical history including a previous clavicle fracture.  He states that ever since he had that repaired that he has had difficulty in his left upper extremity.  This seems to be mostly in the median distribution.  He has been having multiple different attempts at management.  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  He had a workup with neurology which demonstrated a median neuropathy above the level of the pronator teres.  He is here today for evaluation second opinion and treatment options for his median distribution pain.  He does not not regularly practice any repetitive movements with his left forearm in pronation or supination.  Review of Systems:  A 10 point review of systems is negative, except for the pertinent positives and negatives detailed in the HPI.  Past Medical History: Past Medical History:  Diagnosis Date   Anxiety    Back pain    GERD (gastroesophageal reflux disease)    Infertility male    Lactose intolerance    Obesity    Sleep apnea     Past Surgical History: Past Surgical History:  Procedure Laterality Date   TESTICLE REMOVAL     TESTICULAR PROSTHETIC INSERTION      Allergies: Allergies as of 01/09/2024 - Review Complete 01/09/2024  Allergen Reaction Noted   Cephalosporins Anaphylaxis and Swelling 03/25/2015    Medications:  Current Outpatient Medications:    acetaZOLAMIDE  (DIAMOX ) 125 MG tablet, Take 1 tablet by mouth twice a day for altitude sickness prevention. Start 24 hours prior to ascent and stop 2-3 days after descent., Disp: 28 tablet, Rfl: 1   clomiPHENE  (CLOMID ) 50 MG tablet, Take 25  mg by mouth 2 (two) times a week., Disp: , Rfl:    clomiPHENE  (CLOMID ) 50 MG tablet, Take 1/2 tablet (25 mg total) by mouth two times per week. Will be patient's last fill from wellness., Disp: 4 tablet, Rfl: 0   dexmethylphenidate  (FOCALIN  XR) 20 MG 24 hr capsule, Take 1 capsule (20 mg total) by mouth daily., Disp: 90 capsule, Rfl: 0   ketoconazole  (NIZORAL ) 2 % cream, Apply to affected area topically once daily., Disp: 15 g, Rfl: 0   LORazepam  (ATIVAN ) 0.5 MG tablet, Take 1-2 tablets 30 - 60 min prior to MRI. Do not drive with this medicine., Disp: 4 tablet, Rfl: 0   meloxicam  (MOBIC ) 15 MG tablet, Take 1 tablet (15 mg total) by mouth daily., Disp: 30 tablet, Rfl: 1   methocarbamol (ROBAXIN) 500 MG tablet, Take 1 tablet (500 mg total) by mouth every 8 (eight) hours as needed for muscle spasms., Disp: 90 tablet, Rfl: 12   Multiple Vitamins-Minerals (MULTIVITAMIN & MINERAL PO), Take by mouth., Disp: , Rfl:    Naproxen Sodium (ALEVE PO), Take by mouth as needed., Disp: , Rfl:    pantoprazole  (PROTONIX ) 40 MG tablet, Take 1 tablet (40 mg total) by mouth daily as needed., Disp: 90 tablet, Rfl: 3   pantoprazole  (PROTONIX ) 40 MG tablet, Take 1 tablet (40 mg total) by mouth daily as needed., Disp: 30 tablet, Rfl: 0   terbinafine  (LAMISIL ) 250 MG tablet, Take 1 tablet (250 mg total) by  mouth daily., Disp: 84 tablet, Rfl: 0   tirzepatide  (MOUNJARO ) 10 MG/0.5ML Pen, Inject 1 pen (0.5 mL) into the skin once a week., Disp: 6 mL, Rfl: 2   trimethoprim -polymyxin b  (POLYTRIM ) ophthalmic solution, Place 1 drop into the left eye every 6 (six) hours., Disp: 10 mL, Rfl: 0  Social History: Social History   Tobacco Use   Smoking status: Never   Smokeless tobacco: Never  Substance Use Topics   Alcohol use: Yes    Comment: occassionally   Drug use: Never    Family Medical History: Family History  Problem Relation Age of Onset   Diabetes Mother    Sleep apnea Mother    Obesity Mother    Hypertension Father     Hyperlipidemia Father    Depression Father    Anxiety disorder Father    Sleep apnea Father    Obesity Father     Physical Examination: Vitals:   01/09/24 1352  BP: 122/86    General: Patient is in no apparent distress. Attention to examination is appropriate.  NEUROLOGICAL:     Awake, alert, oriented to person, place, and time.  Speech is clear and fluent.   Cranial Nerves: Pupils equal round and reactive to light.  Facial tone is symmetric. Shoulder shrug is symmetric. Tongue protrusion is midline.  There is no pronator drift.  Motor Exam:  Proximal examination shows good strength in the left upper extremity, he does have some very mild deficits noted in his median distribution, however this is only noticeable when comparing to the contralateral side which is his dominant side so is unsure whether or not this is a dominant or nondominant difference or if there is true pathological changes.    He does have some slight decrease in sensation in the median nerve distribution at the this level.  He has a significant infraclavicular Tinel which reproduces his pain radiating down his arm into his median nerve distribution.  There is no clear Tinel's sign at the level of the elbow or pronator teres.    Medical Decision Making  Imaging: Reviewed his brachial plexus/upper extremity MRI no evidence of active ongoing compression of the brachial plexus or neurovascular structures.  Electrodiagnostics: I reviewed his electrodiagnostics done a little over a year ago with Dr. Tobie at [facility] neurology.  Demonstrated the median neuropathy with evidence of reinnervation.  This localized proximal to the level of the pronator teres.  I have personally reviewed the images and electrodiagnostics and agree with the above interpretation.  Assessment and Plan: [name] is a pleasant 38 y.o. male with history of continued left upper extremity pain after a clavicular repair.  He has been  having difficulty with nerve pain ever since this time.  He has had a significant amount of workup including EMG and nerve conduction studies which demonstrated a median neuropathy proximal to the level of the pronator teres.  He had a MRI of the brachial plexus which did not demonstrate any ongoing compression.  He is sent to me for evaluation and possible nerve based therapies to help with his ongoing chronic nerve pain localized to the median distribution, this is including the palm so is not consistent with carpal tunnel syndrome.  He is also had carpal tunnel injections which did not help with his pain.  On physical examination he has very mild weakness noted in his median nerve distribution no evidence of pathologic wasting.  He does have a severe Tinel sign at the infraclavicular site that  reproduces pain in the median nerve distribution.  He has decree sensation in the median nerve distribution.  His EMG localizes to a median neuropathy above the level of the elbow.  I obtained x-rays of the elbow today to evaluate for any hint at the Struthers ligament which was negative.  With his history and examination I would localize his likely site for the median neuropathy to be in the region of the clavicular space.  This would explain why epidural spinal injections nor median nerve injections at the wrist have given him any relief.  He would likely find benefit from a scalene region block if this was done with an anesthetic, should he have a positive result from that could benefit possibly from a nerve stimulator trial for the upper trunk or median sensory distribution.  We also discussed the possibility of more central stimulation like a spinal cord stimulator to help with his appendicular pain.  He has only tried gabapentin for which he he is no longer taking due to the brain fog.  We discussed alternative neuropathic pain medications including Cymbalta or Lyrica.  I asked him to review this with his primary  care and pain team that he may be a good candidate for Cymbalta as previous neuropathic medications have caused significant amount of sedation.  Happy to be available as needed and would like to follow-up with him after this continued conservative management.  If he needs to move forward with a scalene block or Sprint stimulator I will be happy to help him coordinate this with our pain team.  Thank you for involving me in the care of this patient.    Penne MICAEL Sharps MD/MSCR Neurosurgery - Peripheral Nerve Surgery

## 2024-01-05 ENCOUNTER — Encounter: Payer: Self-pay | Admitting: Family Medicine

## 2024-01-08 ENCOUNTER — Other Ambulatory Visit (HOSPITAL_COMMUNITY): Payer: Self-pay

## 2024-01-08 MED ORDER — CLOMIPHENE CITRATE 50 MG PO TABS
25.0000 mg | ORAL_TABLET | ORAL | 0 refills | Status: DC
  Filled 2024-01-08: qty 4, 28d supply, fill #0

## 2024-01-09 ENCOUNTER — Other Ambulatory Visit: Payer: Self-pay

## 2024-01-09 ENCOUNTER — Other Ambulatory Visit

## 2024-01-09 ENCOUNTER — Other Ambulatory Visit: Payer: Self-pay | Admitting: Neurosurgery

## 2024-01-09 ENCOUNTER — Other Ambulatory Visit (HOSPITAL_COMMUNITY): Payer: Self-pay

## 2024-01-09 ENCOUNTER — Ambulatory Visit (INDEPENDENT_AMBULATORY_CARE_PROVIDER_SITE_OTHER): Admitting: Neurosurgery

## 2024-01-09 VITALS — BP 122/86 | Wt 251.2 lb

## 2024-01-09 DIAGNOSIS — G5612 Other lesions of median nerve, left upper limb: Secondary | ICD-10-CM

## 2024-01-09 DIAGNOSIS — M25522 Pain in left elbow: Secondary | ICD-10-CM | POA: Diagnosis not present

## 2024-01-09 DIAGNOSIS — Z0189 Encounter for other specified special examinations: Secondary | ICD-10-CM | POA: Diagnosis not present

## 2024-01-09 DIAGNOSIS — R52 Pain, unspecified: Secondary | ICD-10-CM

## 2024-01-10 NOTE — Discharge Instructions (Signed)

## 2024-01-11 ENCOUNTER — Ambulatory Visit
Admission: RE | Admit: 2024-01-11 | Discharge: 2024-01-11 | Disposition: A | Discharge status: Home / Self Care | Source: Ambulatory Visit | Attending: Family Medicine | Admitting: Family Medicine

## 2024-01-11 DIAGNOSIS — M5412 Radiculopathy, cervical region: Secondary | ICD-10-CM

## 2024-01-11 DIAGNOSIS — M4722 Other spondylosis with radiculopathy, cervical region: Secondary | ICD-10-CM | POA: Diagnosis not present

## 2024-01-11 MED ORDER — IOPAMIDOL (ISOVUE-M 300) INJECTION 61%
1.0000 mL | Freq: Once | INTRAMUSCULAR | Status: AC | PRN
  Administered 2024-01-11: 1 mL via EPIDURAL

## 2024-01-11 MED ORDER — TRIAMCINOLONE ACETONIDE 40 MG/ML IJ SUSP (RADIOLOGY)
60.0000 mg | Freq: Once | INTRAMUSCULAR | Status: AC
  Administered 2024-01-11: 60 mg via EPIDURAL

## 2024-01-17 DIAGNOSIS — R351 Nocturia: Secondary | ICD-10-CM | POA: Diagnosis not present

## 2024-01-17 DIAGNOSIS — K219 Gastro-esophageal reflux disease without esophagitis: Secondary | ICD-10-CM | POA: Diagnosis not present

## 2024-01-17 DIAGNOSIS — G4733 Obstructive sleep apnea (adult) (pediatric): Secondary | ICD-10-CM | POA: Diagnosis not present

## 2024-01-17 DIAGNOSIS — R632 Polyphagia: Secondary | ICD-10-CM | POA: Diagnosis not present

## 2024-01-22 ENCOUNTER — Telehealth: Payer: Self-pay | Admitting: Family Medicine

## 2024-01-22 DIAGNOSIS — M79602 Pain in left arm: Secondary | ICD-10-CM

## 2024-01-22 DIAGNOSIS — G540 Brachial plexus disorders: Secondary | ICD-10-CM

## 2024-01-22 NOTE — Telephone Encounter (Signed)
-----   Message from [facility] sent at 12/20/2023  1:30 PM EDT ----- Regarding: RE: Left arm paresthesia My partner [name] might be appropriate - he does all peripheral nerve and might have options for the patient.  Reeves ----- Message ----- From: Joane Artist RAMAN, MD Sent: 12/20/2023  10:06 AM EDT To: Reeves Daisy, MD; Elspeth Parker, MD Subject: Left arm paresthesia                           I have been seeing Andre Perez for about a year now. He had a left clavicle fracture spring 2024 that required surgery. Ultimately he did have removal of hardware Feb 2025. He continues to have bothersome hard to pin down left arm paresthesia. Cspine MRI did show some compression, EMG did show nerve compression proximal to the elbow. He is doing ok but not great. Do you think a consultation with either of you would be helpful?  Thanks, Andre Perez

## 2024-01-22 NOTE — Telephone Encounter (Signed)
 After discussing with my surgery colleagues will refer you to Dr. Penne Sharps neurosurgeon.  You should hear from them soon.

## 2024-01-24 DIAGNOSIS — E66811 Obesity, class 1: Secondary | ICD-10-CM | POA: Diagnosis not present

## 2024-01-25 ENCOUNTER — Encounter: Payer: Self-pay | Admitting: Family Medicine

## 2024-01-29 ENCOUNTER — Other Ambulatory Visit (HOSPITAL_COMMUNITY): Payer: Self-pay

## 2024-01-29 MED ORDER — LISDEXAMFETAMINE DIMESYLATE 20 MG PO CAPS
20.0000 mg | ORAL_CAPSULE | ORAL | 0 refills | Status: DC
  Filled 2024-01-29: qty 30, 30d supply, fill #0

## 2024-02-06 DIAGNOSIS — Z713 Dietary counseling and surveillance: Secondary | ICD-10-CM | POA: Diagnosis not present

## 2024-02-12 ENCOUNTER — Other Ambulatory Visit (HOSPITAL_COMMUNITY): Payer: Self-pay

## 2024-02-12 ENCOUNTER — Other Ambulatory Visit: Payer: Self-pay

## 2024-02-18 DIAGNOSIS — G4733 Obstructive sleep apnea (adult) (pediatric): Secondary | ICD-10-CM | POA: Diagnosis not present

## 2024-02-22 ENCOUNTER — Other Ambulatory Visit (HOSPITAL_COMMUNITY): Payer: Self-pay

## 2024-02-22 MED ORDER — LISDEXAMFETAMINE DIMESYLATE 30 MG PO CAPS
30.0000 mg | ORAL_CAPSULE | ORAL | 0 refills | Status: DC
  Filled 2024-02-22: qty 30, 30d supply, fill #0

## 2024-02-22 NOTE — Addendum Note (Signed)
 Addended by: JOANE ARTIST RAMAN on: 02/22/2024 12:22 PM   Modules accepted: Orders

## 2024-02-25 ENCOUNTER — Other Ambulatory Visit (HOSPITAL_COMMUNITY): Payer: Self-pay

## 2024-02-26 ENCOUNTER — Other Ambulatory Visit (HOSPITAL_COMMUNITY): Payer: Self-pay

## 2024-02-26 MED ORDER — MELOXICAM 15 MG PO TABS
15.0000 mg | ORAL_TABLET | Freq: Every day | ORAL | 1 refills | Status: AC
  Filled 2024-02-26: qty 30, 30d supply, fill #0
  Filled 2024-03-27: qty 30, 30d supply, fill #1

## 2024-02-26 MED ORDER — CLOMIPHENE CITRATE 50 MG PO TABS
25.0000 mg | ORAL_TABLET | ORAL | 0 refills | Status: AC
  Filled 2024-02-26: qty 12, 84d supply, fill #0

## 2024-02-27 ENCOUNTER — Ambulatory Visit: Admitting: Family Medicine

## 2024-03-07 NOTE — Progress Notes (Unsigned)
"       ° ° °  Andre Perez is a 38 y.o. male who presents to [facility] today for f/u L arm paresthesia occurring after a clavicle fx. Pt was last seen by Dr. Joane on 12/20/23 a cervical ESI was ordered.  He was later referred to neurosurgery and had a visit on 01/09/24.  Today, pt reports ***  Dx testing: 09/18/23 Brachial plexus MRI 02/05/23 C-spine MRI 01/22/23 C-spine XR 01/18/23 NCV study  Pertinent review of systems: ***  Relevant historical information: ***   Exam:  There were no vitals taken for this visit. General: Well Developed, well nourished, and in no acute distress.   MSK: ***    Lab and Radiology Results No results found for this or any previous visit (from the past 72 hours). No results found.     Assessment and Plan: 38 y.o. male with ***   PDMP not reviewed this encounter. No orders of the defined types were placed in this encounter.  No orders of the defined types were placed in this encounter.    Discussed warning signs or symptoms. Please see discharge instructions. Patient expresses understanding.   ***  "

## 2024-03-10 ENCOUNTER — Other Ambulatory Visit (HOSPITAL_COMMUNITY): Payer: Self-pay

## 2024-03-10 ENCOUNTER — Encounter: Payer: Self-pay | Admitting: Family Medicine

## 2024-03-10 ENCOUNTER — Ambulatory Visit: Admitting: Family Medicine

## 2024-03-10 VITALS — BP 134/92 | HR 87 | Ht 74.0 in | Wt 261.0 lb

## 2024-03-10 DIAGNOSIS — F902 Attention-deficit hyperactivity disorder, combined type: Secondary | ICD-10-CM

## 2024-03-10 DIAGNOSIS — M5412 Radiculopathy, cervical region: Secondary | ICD-10-CM | POA: Diagnosis not present

## 2024-03-10 DIAGNOSIS — G540 Brachial plexus disorders: Secondary | ICD-10-CM

## 2024-03-10 MED ORDER — LISDEXAMFETAMINE DIMESYLATE 30 MG PO CAPS
30.0000 mg | ORAL_CAPSULE | ORAL | 0 refills | Status: AC
  Filled 2024-03-10 – 2024-03-31 (×2): qty 90, 90d supply, fill #0

## 2024-03-10 MED ORDER — PREGABALIN 75 MG PO CAPS
75.0000 mg | ORAL_CAPSULE | Freq: Two times a day (BID) | ORAL | 3 refills | Status: DC | PRN
  Filled 2024-03-10: qty 60, 30d supply, fill #0

## 2024-03-10 NOTE — Patient Instructions (Addendum)
 Thank you for coming in today.   Let me know when we need to order a neck injection  Let me know how it goes with the Lyrica .

## 2024-03-27 ENCOUNTER — Encounter: Payer: Self-pay | Admitting: Family Medicine

## 2024-03-27 NOTE — Telephone Encounter (Signed)
 Forwarding to Dr. Denyse Amass to review and advise.

## 2024-03-28 ENCOUNTER — Other Ambulatory Visit (HOSPITAL_COMMUNITY): Payer: Self-pay

## 2024-03-28 MED ORDER — PREGABALIN 100 MG PO CAPS
100.0000 mg | ORAL_CAPSULE | Freq: Two times a day (BID) | ORAL | 3 refills | Status: AC
  Filled 2024-03-28: qty 60, 30d supply, fill #0

## 2024-03-31 ENCOUNTER — Other Ambulatory Visit: Payer: Self-pay

## 2024-03-31 ENCOUNTER — Other Ambulatory Visit (HOSPITAL_COMMUNITY): Payer: Self-pay

## 2024-04-03 ENCOUNTER — Other Ambulatory Visit (HOSPITAL_COMMUNITY): Payer: Self-pay

## 2024-04-04 ENCOUNTER — Other Ambulatory Visit (HOSPITAL_COMMUNITY): Payer: Self-pay
